# Patient Record
Sex: Female | Born: 1970
Health system: Southern US, Community
[De-identification: ages and names within clinical notes are randomized; demographics above are authoritative.]

## PROBLEM LIST (undated history)

## (undated) ENCOUNTER — Ambulatory Visit

## (undated) ENCOUNTER — Encounter

## (undated) ENCOUNTER — Ambulatory Visit: Attending: Pharmacist | Primary: Pharmacist

## (undated) ENCOUNTER — Ambulatory Visit: Attending: General Acute Care Hospital | Primary: General Acute Care Hospital

## (undated) DIAGNOSIS — F419 Anxiety disorder, unspecified: Secondary | ICD-10-CM

## (undated) DIAGNOSIS — L209 Atopic dermatitis, unspecified: Secondary | ICD-10-CM

## (undated) DIAGNOSIS — E669 Obesity, unspecified: Secondary | ICD-10-CM

## (undated) DIAGNOSIS — Z8711 Personal history of peptic ulcer disease: Secondary | ICD-10-CM

## (undated) DIAGNOSIS — K649 Unspecified hemorrhoids: Secondary | ICD-10-CM

## (undated) DIAGNOSIS — S92309A Fracture of unspecified metatarsal bone(s), unspecified foot, initial encounter for closed fracture: Secondary | ICD-10-CM

## (undated) DIAGNOSIS — K922 Gastrointestinal hemorrhage, unspecified: Secondary | ICD-10-CM

## (undated) DIAGNOSIS — E119 Type 2 diabetes mellitus without complications: Secondary | ICD-10-CM

## (undated) DIAGNOSIS — E1142 Type 2 diabetes mellitus with diabetic polyneuropathy: Secondary | ICD-10-CM

## (undated) DIAGNOSIS — G510 Bell's palsy: Secondary | ICD-10-CM

## (undated) DIAGNOSIS — E781 Pure hyperglyceridemia: Secondary | ICD-10-CM

## (undated) DIAGNOSIS — E785 Hyperlipidemia, unspecified: Secondary | ICD-10-CM

## (undated) DIAGNOSIS — I10 Essential (primary) hypertension: Secondary | ICD-10-CM

## (undated) DIAGNOSIS — J358 Other chronic diseases of tonsils and adenoids: Secondary | ICD-10-CM

## (undated) DIAGNOSIS — N200 Calculus of kidney: Secondary | ICD-10-CM

## (undated) DIAGNOSIS — I319 Disease of pericardium, unspecified: Secondary | ICD-10-CM

## (undated) DIAGNOSIS — K449 Diaphragmatic hernia without obstruction or gangrene: Secondary | ICD-10-CM

## (undated) DIAGNOSIS — E282 Polycystic ovarian syndrome: Secondary | ICD-10-CM

## (undated) DIAGNOSIS — K219 Gastro-esophageal reflux disease without esophagitis: Secondary | ICD-10-CM

## (undated) HISTORY — DX: Bell's palsy: G51.0

## (undated) HISTORY — DX: Obesity, unspecified: E66.9

## (undated) HISTORY — PX: CHOLECYSTECTOMY: SHX55

## (undated) HISTORY — DX: Disease of pericardium, unspecified: I31.9

## (undated) HISTORY — DX: Type 2 diabetes mellitus with diabetic polyneuropathy: E11.42

## (undated) HISTORY — DX: Gastro-esophageal reflux disease without esophagitis: K21.9

## (undated) HISTORY — DX: Anxiety disorder, unspecified: F41.9

## (undated) HISTORY — DX: Diaphragmatic hernia without obstruction or gangrene: K44.9

## (undated) HISTORY — DX: Pure hyperglyceridemia: E78.1

## (undated) HISTORY — DX: Gastrointestinal hemorrhage, unspecified: K92.2

## (undated) HISTORY — DX: Atopic dermatitis, unspecified: L20.9

## (undated) HISTORY — DX: Calculus of kidney: N20.0

## (undated) HISTORY — DX: Unspecified hemorrhoids: K64.9

## (undated) HISTORY — DX: Essential (primary) hypertension: I10

## (undated) HISTORY — DX: Hyperlipidemia, unspecified: E78.5

## (undated) HISTORY — PX: ABDOMINAL HYSTERECTOMY: SHX81

## (undated) HISTORY — DX: Other chronic diseases of tonsils and adenoids: J35.8

## (undated) HISTORY — DX: Fracture of unspecified metatarsal bone(s), unspecified foot, initial encounter for closed fracture: S92.309A

## (undated) HISTORY — DX: Personal history of peptic ulcer disease: Z87.11

## (undated) HISTORY — DX: Polycystic ovarian syndrome: E28.2

## (undated) HISTORY — DX: Type 2 diabetes mellitus without complications: E11.9

## (undated) HISTORY — PX: TUBAL LIGATION: SHX77

---

## 2004-05-31 ENCOUNTER — Ambulatory Visit: Payer: Self-pay | Admitting: Family Medicine

## 2004-06-24 ENCOUNTER — Ambulatory Visit: Payer: Self-pay | Admitting: Family Medicine

## 2004-09-21 ENCOUNTER — Ambulatory Visit: Payer: Self-pay | Admitting: Family Medicine

## 2005-01-14 ENCOUNTER — Ambulatory Visit: Payer: Self-pay | Admitting: Family Medicine

## 2005-05-31 ENCOUNTER — Ambulatory Visit: Payer: Self-pay | Admitting: Family Medicine

## 2005-07-01 ENCOUNTER — Ambulatory Visit: Payer: Self-pay | Admitting: Family Medicine

## 2005-07-28 ENCOUNTER — Ambulatory Visit: Payer: Self-pay | Admitting: Family Medicine

## 2006-05-09 DIAGNOSIS — I319 Disease of pericardium, unspecified: Secondary | ICD-10-CM

## 2006-05-09 HISTORY — DX: Disease of pericardium, unspecified: I31.9

## 2008-10-15 ENCOUNTER — Encounter: Payer: Self-pay | Admitting: Gastroenterology

## 2008-10-15 HISTORY — PX: COLONOSCOPY: SHX174

## 2012-09-08 ENCOUNTER — Encounter: Payer: Self-pay | Admitting: *Deleted

## 2012-09-08 ENCOUNTER — Encounter: Payer: 59 | Attending: Family Medicine | Admitting: *Deleted

## 2012-09-08 DIAGNOSIS — Z713 Dietary counseling and surveillance: Secondary | ICD-10-CM | POA: Insufficient documentation

## 2012-09-08 DIAGNOSIS — E119 Type 2 diabetes mellitus without complications: Secondary | ICD-10-CM | POA: Insufficient documentation

## 2012-09-08 NOTE — Progress Notes (Deleted)
Patient was seen on 09/08/2012 for the complete series of three diabetes self-management courses at [facility].  Current A1c = 7.3% on 08/2012  The following learning objectives were met by the patient during this course:  Core 1:  Gain an understanding of diabetes and what causes it  Learn how diabetes is treated and the goals of treatment  Learn why, how and when to test BG  Learn how carbohydrate affects your glucose  Receive a personal food plan and learn how to count carbohydrates  Discover how physical activity enhances glucose control and overall health  Begin to gain confidence in your ability to manage diabetes Handouts given during this class include:  Type 2 Diabetes: Basics Book  My Food Plan Book  Food and Activity Log Core 2:  Describe causes, symptoms and treatment of hypoglycemia and hyperglycemia Learn how to care for your glucose meter and strips Explain how to manage diabetes during illness  List strategies to follow meal plan when dining out  Describe the effects of alcohol on glucose and how to use it safely  Describe problem solving skills for day-to-day glucose challenges  Describe ways to remain physically active  Describe the impact of regular activity on insulin resistance Handouts given in this class:  Refrigerator magnet for Sick Day Guidelines  [facility] Oral Medication and Insulin handout  Nutritional Strategies for Weight Loss with Diabetes Core 3  Describe how diabetes changes over time  Understand why glucose may be out of target  Learn how diabetes changes over time  Learn about blood pressure, cholesterol, and heart health  Learn about lowering dietary fat and sodium  Understand the benefits of physical activity for heart health Develop problem solving skills for times when glucose numbers are puzzling Develop strategies for creating life balance  Learn how to identify if your treatment plan needs to change  Develop  strategies for dealing with stress, depression and staying motivated  Identify healthy weight-loss plans  Gain confidence that you can succeed in caring for your diabetes  Establish 2-3 goals that they will plan to diligently work on until they return  for the free 53-month follow-up visit  The following handouts were given in class:  3 Month Follow Up Visit handout  Goal setting handout  Class evaluation form  Stress Management Handout Your patient has established the following 3 month goals for diabetes self-care:  Be active 45 minutes or more 4 days a weekl  Test my glucose twice a day, 7 days a week  Follow-Up Plan:  Patient was offered a 3 month follow-up visit for diabetes self-management education.

## 2012-09-08 NOTE — Progress Notes (Signed)
Patient was seen on 09/08/2012 for the complete series of three diabetes self-management courses at [facility].  Current A1c = stated 7.3% on 08/13/2012  The following learning objectives were met by the patient during this course:  Core 1:  Gain an understanding of diabetes and what causes it  Learn how diabetes is treated and the goals of treatment  Learn why, how and when to test BG  Learn how carbohydrate affects your glucose  Receive a personal food plan and learn how to count carbohydrates  Discover how physical activity enhances glucose control and overall health  Begin to gain confidence in your ability to manage diabetes Handouts given during this class include:  Type 2 Diabetes: Basics Book  My Food Plan Book  Food and Activity Log Core 2:  Describe causes, symptoms and treatment of hypoglycemia and hyperglycemia Learn how to care for your glucose meter and strips Explain how to manage diabetes during illness  List strategies to follow meal plan when dining out  Describe the effects of alcohol on glucose and how to use it safely  Describe problem solving skills for day-to-day glucose challenges  Describe ways to remain physically active  Describe the impact of regular activity on insulin resistance Handouts given in this class:  Refrigerator magnet for Sick Day Guidelines  [facility] Oral Medication and Insulin handout  Nutritional Strategies for Weight Loss with Diabetes Core 3  Describe how diabetes changes over time  Understand why glucose Freddy Kinne be out of target  Learn how diabetes changes over time  Learn about blood pressure, cholesterol, and heart health  Learn about lowering dietary fat and sodium  Understand the benefits of physical activity for heart health Develop problem solving skills for times when glucose numbers are puzzling Develop strategies for creating life balance  Learn how to identify if your treatment plan needs to change   Develop strategies for dealing with stress, depression and staying motivated  Identify healthy weight-loss plans  Gain confidence that you can succeed in caring for your diabetes  Establish 2-3 goals that they will plan to diligently work on until they return  for the free 3-month follow-up visit  The following handouts were given in class:  3 Month Follow Up Visit handout  Goal setting handout  Class evaluation form  Stress Management Handout Your patient has established the following 3 month goals for diabetes self-care:  Be active 45 minutes or more 4 times a week  Test my glucose 2 times a day 7 days a week  Follow-Up Plan:  Patient was offered a 3 month follow-up visit for diabetes self-management education.  

## 2012-09-08 NOTE — Patient Instructions (Signed)
Goals:  Follow Diabetes Meal Plan as instructed  Eat 3 meals and 2 snacks, every 3-5 hrs  Limit carbohydrate intake to 30-45 grams carbohydrate/meal  Limit carbohydrate intake to 0-15 grams carbohydrate/snack  Add lean protein foods to meals/snacks  Monitor glucose levels as instructed by your doctor  Aim for 15-30 mins of physical activity daily  Bring food record and glucose log to your next nutrition visit

## 2012-09-17 ENCOUNTER — Other Ambulatory Visit: Payer: Self-pay | Admitting: Internal Medicine

## 2012-09-17 DIAGNOSIS — R079 Chest pain, unspecified: Secondary | ICD-10-CM

## 2012-09-18 ENCOUNTER — Encounter: Payer: Self-pay | Admitting: *Deleted

## 2012-09-25 ENCOUNTER — Encounter: Payer: Self-pay | Admitting: Pharmacist

## 2012-09-27 ENCOUNTER — Telehealth: Payer: Self-pay | Admitting: Internal Medicine

## 2012-09-27 ENCOUNTER — Ambulatory Visit (HOSPITAL_COMMUNITY)
Admission: RE | Admit: 2012-09-27 | Discharge: 2012-09-27 | Disposition: A | Discharge status: Home / Self Care | Payer: 59 | Source: Ambulatory Visit | Attending: Internal Medicine | Admitting: Internal Medicine

## 2012-09-27 DIAGNOSIS — R6889 Other general symptoms and signs: Secondary | ICD-10-CM | POA: Insufficient documentation

## 2012-09-27 DIAGNOSIS — R079 Chest pain, unspecified: Secondary | ICD-10-CM

## 2012-09-27 LAB — BUN: BUN: 10 mg/dL (ref 6–23)

## 2012-09-27 LAB — CREATININE, SERUM: GFR calc Af Amer: >90 mL/min (ref >90)

## 2012-09-27 MED ORDER — NITROGLYCERIN 0.4 MG SL SUBL
SUBLINGUAL_TABLET | SUBLINGUAL | Status: AC
  Filled 2012-09-27: qty 25

## 2012-09-27 MED ORDER — METOPROLOL TARTRATE 1 MG/ML IV SOLN
INTRAVENOUS | Status: AC
  Filled 2012-09-27: qty 5

## 2012-09-27 MED ORDER — METOPROLOL TARTRATE 1 MG/ML IV SOLN
INTRAVENOUS | Status: AC
  Administered 2012-09-27: 5 mg
  Filled 2012-09-27: qty 10

## 2012-09-27 NOTE — Telephone Encounter (Signed)
Message forwarded to L. Ingold, NP for further instructions.  

## 2012-09-27 NOTE — Telephone Encounter (Signed)
Morgan Drake is calling because Marguriete Wootan is there to get a CT of the Heart and they need an order to give a Stat Bun and Creatine . She needs to have a IV Contrast and cannot get the CT with the labs .Marland KitchenPlease Call Morgan Drake at [phone]   Thanks

## 2012-09-27 NOTE — Telephone Encounter (Signed)
Discussed w/ L. Annie Paras, NP and verbal order for BMP given.  Call to [facility] and no answer.  Orders in epic for BMP.

## 2012-10-02 ENCOUNTER — Ambulatory Visit (HOSPITAL_COMMUNITY)
Admission: RE | Admit: 2012-10-02 | Discharge: 2012-10-02 | Disposition: A | Discharge status: Home / Self Care | Payer: 59 | Source: Ambulatory Visit | Attending: Internal Medicine | Admitting: Internal Medicine

## 2012-10-02 DIAGNOSIS — R079 Chest pain, unspecified: Secondary | ICD-10-CM | POA: Insufficient documentation

## 2012-10-02 DIAGNOSIS — R6884 Jaw pain: Secondary | ICD-10-CM | POA: Insufficient documentation

## 2012-10-02 MED ORDER — IOHEXOL 350 MG/ML SOLN
80.0000 mL | Freq: Once | INTRAVENOUS | Status: AC | PRN
  Administered 2012-10-02: 80 mL via INTRAVENOUS

## 2012-10-02 MED ORDER — METOPROLOL TARTRATE 1 MG/ML IV SOLN
INTRAVENOUS | Status: AC
  Administered 2012-10-02: 5 mg
  Filled 2012-10-02: qty 10

## 2012-10-02 MED ORDER — NITROGLYCERIN 0.4 MG SL SUBL
SUBLINGUAL_TABLET | SUBLINGUAL | Status: AC
  Administered 2012-10-02: 16:00:00
  Filled 2012-10-02: qty 25

## 2012-10-02 MED ORDER — METOPROLOL TARTRATE 1 MG/ML IV SOLN
5.0000 mg | INTRAVENOUS | Status: DC | PRN
  Administered 2012-10-02: 5 mg via INTRAVENOUS

## 2012-10-03 NOTE — Progress Notes (Signed)
Patient was seen on 09/08/2012 for the complete series of three diabetes self-management courses at [facility].  Current A1c = stated 7.3% on 08/13/2012  The following learning objectives were met by the patient during this course:  Core 1:  Gain an understanding of diabetes and what causes it  Learn how diabetes is treated and the goals of treatment  Learn why, how and when to test BG  Learn how carbohydrate affects your glucose  Receive a personal food plan and learn how to count carbohydrates  Discover how physical activity enhances glucose control and overall health  Begin to gain confidence in your ability to manage diabetes Handouts given during this class include:  Type 2 Diabetes: Basics Book  My Food Plan Book  Food and Activity Log Core 2:  Describe causes, symptoms and treatment of hypoglycemia and hyperglycemia Learn how to care for your glucose meter and strips Explain how to manage diabetes during illness  List strategies to follow meal plan when dining out  Describe the effects of alcohol on glucose and how to use it safely  Describe problem solving skills for day-to-day glucose challenges  Describe ways to remain physically active  Describe the impact of regular activity on insulin resistance Handouts given in this class:  Refrigerator magnet for Sick Day Guidelines  [facility] Oral Medication and Insulin handout  Nutritional Strategies for Weight Loss with Diabetes Core 3  Describe how diabetes changes over time  Understand why glucose may be out of target  Learn how diabetes changes over time  Learn about blood pressure, cholesterol, and heart health  Learn about lowering dietary fat and sodium  Understand the benefits of physical activity for heart health Develop problem solving skills for times when glucose numbers are puzzling Develop strategies for creating life balance  Learn how to identify if your treatment plan needs to change   Develop strategies for dealing with stress, depression and staying motivated  Identify healthy weight-loss plans  Gain confidence that you can succeed in caring for your diabetes  Establish 2-3 goals that they will plan to diligently work on until they return  for the free 18-month follow-up visit  The following handouts were given in class:  3 Month Follow Up Visit handout  Goal setting handout  Class evaluation form  Stress Management Handout Your patient has established the following 3 month goals for diabetes self-care:  Be active 45 minutes or more 4 times a week  Test my glucose 2 times a day 7 days a week  Follow-Up Plan:  Patient was offered a 3 month follow-up visit for diabetes self-management education.

## 2012-10-23 ENCOUNTER — Telehealth: Payer: Self-pay | Admitting: Internal Medicine

## 2012-10-23 NOTE — Telephone Encounter (Signed)
Sent a mychart message that the CT overread shows a small nodule which will require a follow-up CT.

## 2012-11-06 ENCOUNTER — Ambulatory Visit (INDEPENDENT_AMBULATORY_CARE_PROVIDER_SITE_OTHER): Payer: Self-pay | Admitting: Family Medicine

## 2012-11-06 DIAGNOSIS — E119 Type 2 diabetes mellitus without complications: Secondary | ICD-10-CM

## 2012-11-06 NOTE — Progress Notes (Signed)
Patient presents for 3 month follow up DM as part of the employee sponsored Link to [employer]. Medications and glucose readings have been reviewed. I have also discussed with patient lifestyle interventions such as diet and exercise. Full documentation of this visit can be found in the [employer] documenting system through [employer] [facility]). Patient has set a series of personal goals and will follow up in 3 months for further review of DM.

## 2012-11-27 NOTE — Progress Notes (Signed)
Patient ID: Morgan Drake, female   DOB: 1971/01/03, 42 y.o.   MRN: [id number] ATTENDING PHYSICIAN NOTE: I have reviewed the chart and agree with the plan as detailed above. Denny Levy MD Pager [phone]

## 2012-12-31 ENCOUNTER — Ambulatory Visit: Payer: 59 | Admitting: *Deleted

## 2013-01-31 ENCOUNTER — Encounter: Payer: Self-pay | Admitting: *Deleted

## 2013-01-31 ENCOUNTER — Encounter: Payer: 59 | Attending: Family Medicine | Admitting: *Deleted

## 2013-01-31 VITALS — Ht 62.5 in | Wt 228.4 lb

## 2013-01-31 DIAGNOSIS — E119 Type 2 diabetes mellitus without complications: Secondary | ICD-10-CM | POA: Insufficient documentation

## 2013-01-31 DIAGNOSIS — Z713 Dietary counseling and surveillance: Secondary | ICD-10-CM | POA: Insufficient documentation

## 2013-01-31 NOTE — Progress Notes (Signed)
  Patient was seen on 01/31/13 for their 3 month follow-up as a part of the diabetes self-management courses at [facility]. The following learning objectives were met by your patient during this course:  A1c has dropped from 7.3% to 6.9% since attending class.  Patient self reports the following:  Diabetes control has improved since diabetes self-management training: it did after class, but other life stresses have gotten in the way Number of days blood glucose is >200: 10-15 times in past 4 months Changes in treatment plan: had to come off of Victoza, currently on Bydureon which is working better Confidence with ability to manage diabetes: 75% at this point. Areas for improvement with diabetes self-care: ongoing meal management and carb counting along with increase in activity level as able. Willingness to participate in diabetes support group: interested but too far to drive after leaving work.  Your patient has established the following 3 month goals for diabetes self-care:  Be active 45 minutes or more 4 times a week - was walking a lot to prepare for Heart Walk, and continues to walk as able  Test my glucose 2 times a day 7 days a week - currently testing once a day, because BG's are within target ranges  Please see Diabetes Flow sheet for findings related to patient's self-care.  Follow-Up Plan: Patient is eligible for a "free" 30 minute diabetes self-care appointment in the next year. Patient to call and schedule as needed.

## 2013-03-05 ENCOUNTER — Ambulatory Visit (INDEPENDENT_AMBULATORY_CARE_PROVIDER_SITE_OTHER): Payer: Self-pay | Admitting: Family Medicine

## 2013-03-05 VITALS — Wt 228.0 lb

## 2013-03-05 DIAGNOSIS — E119 Type 2 diabetes mellitus without complications: Secondary | ICD-10-CM

## 2013-03-05 NOTE — Progress Notes (Signed)
Patient presents for 3 mo f/u DM as part of the employee sponsored Link to [employer]. Medications have been reviewed. I have also discussed with patient lifestyle intervention such as diet and exercise. Full documentation of this visit can be found in the [employer] documenting system through [employer] [facility]). However specifics of this visit include the following:  Diabetes Mellitus: POC A1C 6.4, at goal. Stopped Victoza and started Bydureon in August. plan 1.) patient wants the bydureon pens. Told her to call us a few days before she needs her next refill and we will call to change it to the pens. 2.) has not received pneumovax before-will discuss with MD 3.) needs test strips and lancets, will fax for refills 4.) f/u 3 mo  Hyperlipidemia: Other patient will be getting a new lipid panel drawn soon. Her simvastatin dose will need to be at least 20 mg to be moderate intensity (she will discuss with md). She has had statin intolerances in the past and doesn't know if she can tolerate a higher dose. Discussed with her about crestor and pravastation being less likely to cause muscle aches and that we could try that if needed.  Cost Savings Intervention Outcomes: Medical problem identified; Supplies provided, proactive intervention  Patient has set a series of personal goals and will f/u in 3 mo for further review of DM.

## 2013-03-14 ENCOUNTER — Other Ambulatory Visit: Payer: Self-pay

## 2013-05-20 NOTE — Progress Notes (Signed)
Patient ID: Morgan Drake, female   DOB: [birth year], 43 y.o.   MRN: [id number] ATTENDING PHYSICIAN NOTE: I have reviewed the chart and agree with the plan as detailed above. Dorcas Mcmurray MD Pager [phone]

## 2013-06-18 ENCOUNTER — Ambulatory Visit (INDEPENDENT_AMBULATORY_CARE_PROVIDER_SITE_OTHER): Payer: Self-pay | Admitting: Family Medicine

## 2013-06-18 VITALS — BP 109/75 | HR 72 | Wt 226.0 lb

## 2013-06-18 DIAGNOSIS — E119 Type 2 diabetes mellitus without complications: Secondary | ICD-10-CM

## 2013-06-18 NOTE — Progress Notes (Signed)
Patient presents for 3 mo f/u DM as part of the employee sponsored Link to [employer]. Medications have been reviewed. I have also discussed with patient lifestyle interventions such as diet and exercise. Full documentation of this visit can be found in the [employer]tracker documenting system through [facility]). However specifics of this visit include the following:  Diabetes Mellitus:  POC A1C 6 at goal. MD recently added Farxiga 10 mg. Patient is tolerating well. Counseled on side effects and to watch for signs of possible yeast infections or UTI.  plan 1.) try to check blood sugar 1-2x/week and definitely check with symptoms of hypoglycemia 2.) f/u 3 mo  Hyperlipidemia:  patient has been intolerant to several statins and can only tolerate simvastatin 10 mg. She has tried 20 mg but couldn't tolerate the higher dose.  Hypertension:BP at goal  Patient has set a series of personal goals and will f/u in 3 mo for further review of DM

## 2013-08-29 NOTE — Progress Notes (Signed)
Patient ID: Morgan Drake, female   DOB: 1970/12/31, 43 y.o.   MRN: [id number] ATTENDING PHYSICIAN NOTE: I have reviewed the chart and agree with the plan as detailed above. Dorcas Mcmurray MD Pager [phone]

## 2013-10-01 ENCOUNTER — Ambulatory Visit (INDEPENDENT_AMBULATORY_CARE_PROVIDER_SITE_OTHER): Payer: Self-pay | Admitting: Family Medicine

## 2013-10-01 VITALS — Wt 233.0 lb

## 2013-10-01 DIAGNOSIS — E119 Type 2 diabetes mellitus without complications: Secondary | ICD-10-CM

## 2013-10-01 NOTE — Progress Notes (Signed)
Patient presents for 3 mo f/u DM as part of the employee sponsored Link to [employer]. Medications have been reviewed. I have also discussed with patient lifestyle interventions such as diet and exercise. Full documentation of this visit can be found in the [employer] documenting system through [facility]). However specifics from this visit include the following:  POC A1C 7.6, above goal. Patient has not been taking farxiga everyday. usually takes it every other day. She said there were several times that she didn't take it all. It was 38 days past due to be filled. She got a yeast infection from the [place] too so that's another reason she hasn't taken it.I filled it today and also filled her lancets and test strips  plan 1.) restart farxiga. If yeast infections continue then we will have to switch to alternate treatment (patient says if she even gets 1 more she's done with it). She mentioned wanting to try metformin back again. It had given her stomach upset in the past but she was going through an extremely stressful time and thought that might of had something to do with it. If we end up starting it back, I will make sure she gets the ER formulation. I did discuss long acting insulin (lantus) with patient but she didn't seem thrilled. For now we will restart the [place] and plan to go stick with it unless she gets another yeast infection. 2.) will restart using myfitnesspal again 3.) will start walking (patient has gained 5 lb since last visit) 4.) Begin checking blood sugar every morning and occasional 2 h PPG 5.) set the appointment for 3 mo but will call patient in 2 wks to see if her blood sugar is coming down since restarting the [place].  Patient has set a series of personal goals and will f/u in 3 mo for further review of DM

## 2013-11-28 NOTE — Progress Notes (Signed)
Patient ID: Morgan Drake, female   DOB: Jul 10, 1970, 43 y.o.   MRN: [id number] ATTENDING PHYSICIAN NOTE: I have reviewed the chart and agree with the plan as detailed above. Dorcas Mcmurray MD Pager [phone]

## 2014-01-03 ENCOUNTER — Ambulatory Visit (INDEPENDENT_AMBULATORY_CARE_PROVIDER_SITE_OTHER): Payer: Self-pay | Admitting: Family Medicine

## 2014-01-03 VITALS — Wt 230.0 lb

## 2014-01-03 DIAGNOSIS — E119 Type 2 diabetes mellitus without complications: Secondary | ICD-10-CM

## 2014-01-03 NOTE — Progress Notes (Signed)
Patient presents for 3 mo f/u DM as part of the employee sponsored Link to [employer]. Medications have been reviewed. I have also discussed with patient lifestyle interventions such as diet and exercise. Full documentation of this visit can be found in the [employer] documenting system through [facility]). However, specifics from this visit include the following:  Diabetes Mellitus:   POC A1C 6.4, at goal. Patient recently switched to [facility] [place]. She has not had it in 4 days though because she ran out of samples and we don't have a prescription for it. plan 1.) will call MD office for Rx. This med is a combo of a SGLT2 and a DPP4 inhibitor. She had problems with Invokana in the past causing yeast infections. Counseled patient that it could happen with this medicine as well. 2.) will try to start using myfitnesspal again. Goal exercise 30 minutes 5 days/week. Patient recently got a dog so she's hoping to get in exercise during walks. Hyperlipidemia: simvastatin not at optimal dose but she has had mulitple statin intolerances and this is the only med and dose she can tolerate.  Patient has set a series of personal goals and will f/u in 3 mo for further review of DM

## 2014-01-28 ENCOUNTER — Encounter: Payer: Self-pay | Admitting: Family Medicine

## 2014-01-28 NOTE — Assessment & Plan Note (Signed)
At goal.  See progress notes.

## 2014-01-28 NOTE — Progress Notes (Signed)
Patient ID: Morgan Drake, female   DOB: Aug 15, 1970, 43 y.o.   MRN: [id number] Reviewed: Agree with the documentation and management of our [facility] pharmacologist.

## 2014-05-06 ENCOUNTER — Ambulatory Visit (INDEPENDENT_AMBULATORY_CARE_PROVIDER_SITE_OTHER): Payer: Self-pay | Admitting: Family Medicine

## 2014-05-06 VITALS — BP 111/75 | HR 70 | Wt 225.0 lb

## 2014-05-06 DIAGNOSIS — E119 Type 2 diabetes mellitus without complications: Secondary | ICD-10-CM

## 2014-05-06 NOTE — Progress Notes (Signed)
Patient presents for 3 mo f/u DM as part of the employee sponsored Link to [employer]. Medications have been reviewed. I have also discussed with patient lifestyle interventions such as diet and exercise. Full documentation of this visit can be found in the [employer] documenting system through [facility]). However, specifics from this visit include the following:  Diabetes Mellitus: POC A1C 6.6, at goal. Patient was started on glyxambi and trulicity together. Although it does have some therapeutic overlap (both increase GLP-1 by different mechanisms) patient is at goal and MD doesn't want to change. plan 1.) filled glyxambi today and ordered it for patient to p/u for tomorrow 2.) patient will work on getting in recommended amount of steps with fitbit 3.( f/u 3 mo   Patient has set a series of personal goals and will f/u in 3 mo for further review of DM

## 2014-05-22 NOTE — Progress Notes (Signed)
Patient ID: Morgan Drake, female   DOB: [birth year], 44 y.o.   MRN: [id number] Reviewed: Agree with the documentation and management of our [place].

## 2014-08-05 ENCOUNTER — Other Ambulatory Visit: Payer: Self-pay | Admitting: *Deleted

## 2014-08-06 ENCOUNTER — Encounter: Payer: Self-pay | Admitting: *Deleted

## 2014-08-06 NOTE — Patient Outreach (Signed)
[facility]) Care Management   08/06/2014  Morgan Drake 01-28-1971 [id number]  Morgan Drake is an 44 y.o. female; Therapist, sports at [employer].  Subjective:  States she is bothered with allergies today. She states her fasting blood sugar is 116-124 when she is exercising.  Says she goes to the [facility] 3-4 times a week for sessions lasting at least one hour. Says she also walks at lunchtime at her work. States she is getting married on April 10th.  Objective:   ROS  States her allergies are bothering her today.  Physical Exam  Constitutional: She is oriented to person, place, and time.  Neurological: She is alert and oriented to person, place, and time.   Filed Vitals:   08/06/14 1256  BP: 125/78   Filed Weights   08/06/14 1256  Weight: 227 lb 12.8 oz (103.329 kg)   Current Medications:   Current Outpatient Prescriptions  Medication Sig Dispense Refill  . aspirin 81 MG tablet Take 81 mg by mouth daily.    . Cetirizine HCl (ZYRTEC ALLERGY) 10 MG CAPS Take 10 mg by mouth daily as needed.    . Empagliflozin-Linagliptin (GLYXAMBI) 25-5 MG TABS Take 1 tablet by mouth daily.    Marland Kitchen lisinopril (PRINIVIL,ZESTRIL) 2.5 MG tablet Take 2.5 mg by mouth daily.    Marland Kitchen loratadine (CLARITIN) 10 MG tablet Take 10 mg by mouth daily as needed for allergies.    . Omega-3 Fatty Acids (FISH OIL) 1000 MG CAPS Take 2 capsules by mouth 2 (two) times daily.    . Dulaglutide (TRULICITY) 1.5 OE/7.0JJ SOPN Inject 1.5 mg into the skin once a week.    . simvastatin (ZOCOR) 10 MG tablet Take 10 mg by mouth at bedtime.    . valACYclovir (VALTREX) 1000 MG tablet Take 1,000 mg by mouth daily as needed.      No current facility-administered medications for this visit.    Functional Status:   No flowsheet data found.  Fall/Depression Screening:    PHQ 2/9 Scores 08/05/2014  PHQ - 2 Score 0   THN CM Care Plan        Patient Outreach from 08/05/2014 in [place] Problem One  Type II DM with most recent A1C= 7.0% on 05/26/14 combined with elevated total cholesterol, triglycerides and LDL   Care Plan for Problem One  Active   Interventions for Problem One Long Term Goal  Using a picture representation, reviewed the 8 core pathophysiologic deficits in Type II diabetes. Discussed physiology of diabetes as a chronic progressive disease with the initial problem of insulin resistance in the muscle, liver and fat cells and then increased loss of beta cell function over time resulting in decreased insulin production., Discussed the need for the use of a combination of DM medications to correct the pathophysiologic core deficits and to  prevent or slow beta cell failure, Discussed role of statins, aspirin, and blood pressure medicines in the treatment of diabetes,  reviewed CHO controlled meal plan and discussed strategies to  improve blood  sugar control  with lower glycemic index food choices, provided handout on speicifc strategies to lower  cholesterol, triglycerides, and LDL    THN Long Term Goal (31-90 days)  Improved glycemic control as evidenced by A1C<7.0% at next check on 09/22/14   [facility] Long Term Goal Start Date  08/05/14     Assessment:    Type II DM with most recent  A1C= 7.0% with good understanding of self management strategies   Plan:  [facility] chronic care coordinator will meet quarterly and as needed with patient per Link To Wellness program guidelines to assist with Type II DM self-management and assess patient's progress toward mutually set goals    It was good to see you today. Keep up the good work. Congratulations on your upcoming wedding.  [place] Management Care Coordinator RN, CCM, CDE Office Phone [phone]

## 2014-11-04 ENCOUNTER — Ambulatory Visit: Payer: Self-pay | Admitting: *Deleted

## 2014-11-04 ENCOUNTER — Other Ambulatory Visit: Payer: Self-pay

## 2014-11-04 NOTE — Patient Outreach (Signed)
[facility]) Care Management  11/04/2014  Morgan Drake 02-May-1971 [id number]  Called member to reschedule appt with Kelli Churn today as provider is unavailable.  Member unable to reschedule appt at this time and she will call back to reschedule.  Plan to have care management assistant contact member to reschedule appt Peter Garter RN, [facility] Care Management Coordinator-Link to [place] Management [phone]

## 2014-11-18 ENCOUNTER — Ambulatory Visit: Payer: 59

## 2014-11-18 ENCOUNTER — Ambulatory Visit (INDEPENDENT_AMBULATORY_CARE_PROVIDER_SITE_OTHER): Payer: Self-pay | Admitting: Family Medicine

## 2014-11-18 VITALS — BP 96/67 | HR 77 | Wt 226.0 lb

## 2014-11-18 DIAGNOSIS — E119 Type 2 diabetes mellitus without complications: Secondary | ICD-10-CM

## 2014-11-18 LAB — POCT GLYCOSYLATED HEMOGLOBIN (HGB A1C): Hemoglobin A1C: 6.7

## 2014-11-18 NOTE — Progress Notes (Signed)
Patient presents for 3 mo f/u DM as part of the employee sponsored Link to [employer]. Medications have been reviewed. I have also discussed with patient lifestyle interventions such as diet and exercise.   POC A1C 6.7, at goal since resuming trulicity. No recommended changes. Hyperlipidemia-patient has started zetia since she has multiple statin intolerances. She can only tolerate simvastatin at 10 mg.   Patient has set a series of personal goals and will f/u in 3 mo for further review of DM

## 2014-11-28 ENCOUNTER — Encounter: Payer: Self-pay | Admitting: *Deleted

## 2014-11-28 NOTE — Progress Notes (Signed)
Patient ID: Morgan Drake, female   DOB: [birth year], 44 y.o.   MRN: [id number] ATTENDING PHYSICIAN NOTE: I have reviewed the chart and agree with the plan as detailed above. Dorcas Mcmurray MD Pager [phone]

## 2015-01-01 ENCOUNTER — Encounter: Payer: Self-pay | Admitting: Internal Medicine

## 2015-02-24 ENCOUNTER — Ambulatory Visit (INDEPENDENT_AMBULATORY_CARE_PROVIDER_SITE_OTHER): Payer: 59 | Admitting: Physician Assistant

## 2015-02-24 ENCOUNTER — Encounter: Payer: Self-pay | Admitting: Physician Assistant

## 2015-02-24 ENCOUNTER — Ambulatory Visit (HOSPITAL_COMMUNITY)
Admission: RE | Admit: 2015-02-24 | Discharge: 2015-02-24 | Disposition: A | Discharge status: Home / Self Care | Payer: 59 | Source: Ambulatory Visit | Attending: Cardiovascular Disease | Admitting: Cardiovascular Disease

## 2015-02-24 VITALS — BP 108/76 | HR 78 | Ht 62.5 in | Wt 218.8 lb

## 2015-02-24 DIAGNOSIS — R110 Nausea: Secondary | ICD-10-CM | POA: Diagnosis not present

## 2015-02-24 DIAGNOSIS — Z6838 Body mass index (BMI) 38.0-38.9, adult: Secondary | ICD-10-CM | POA: Diagnosis not present

## 2015-02-24 DIAGNOSIS — R0609 Other forms of dyspnea: Secondary | ICD-10-CM | POA: Insufficient documentation

## 2015-02-24 DIAGNOSIS — R0789 Other chest pain: Secondary | ICD-10-CM

## 2015-02-24 DIAGNOSIS — R0602 Shortness of breath: Secondary | ICD-10-CM | POA: Insufficient documentation

## 2015-02-24 DIAGNOSIS — E119 Type 2 diabetes mellitus without complications: Secondary | ICD-10-CM | POA: Insufficient documentation

## 2015-02-24 DIAGNOSIS — E669 Obesity, unspecified: Secondary | ICD-10-CM | POA: Insufficient documentation

## 2015-02-24 DIAGNOSIS — R9439 Abnormal result of other cardiovascular function study: Secondary | ICD-10-CM | POA: Diagnosis not present

## 2015-02-24 LAB — MYOCARDIAL PERFUSION IMAGING
CHL CUP MPHR: 177 {beats}/min
CHL CUP RESTING HR STRESS: 67 {beats}/min
CHL RATE OF PERCEIVED EXERTION: 17
CSEPEDS: 0 s
CSEPEW: 8.5 METS
Exercise duration (min): 7 min
LV sys vol: 42 mL
LVDIAVOL: 103 mL
NUC STRESS TID: 1
Peak HR: 162 {beats}/min
Percent HR: 91 %
SDS: 0
SRS: 0
SSS: 0

## 2015-02-24 MED ORDER — TECHNETIUM TC 99M SESTAMIBI GENERIC - CARDIOLITE
31.3000 | Freq: Once | INTRAVENOUS | Status: AC | PRN
  Administered 2015-02-24: 31.3 via INTRAVENOUS

## 2015-02-24 MED ORDER — PANTOPRAZOLE SODIUM 40 MG PO TBEC: 40.0000 mg | DELAYED_RELEASE_TABLET | Freq: Two times a day (BID) | ORAL | Status: DC

## 2015-02-24 MED ORDER — TECHNETIUM TC 99M SESTAMIBI GENERIC - CARDIOLITE
10.1000 | Freq: Once | INTRAVENOUS | Status: AC | PRN
  Administered 2015-02-24: 10.1 via INTRAVENOUS

## 2015-02-24 NOTE — Patient Instructions (Addendum)
Your physician has requested that you have en exercise stress myoview. For further information please visit [email or web address]. Please follow instruction sheet, as given.   Your physician has recommended you make the following change in your medication: start pantoprazole prescription. This has been sent to your pharmacy.  Your physician recommends that you schedule a follow-up appointment in: 2 weeks with [place] Poli Barrett requests for you to be seen in the lipid clinic by [facility].

## 2015-02-24 NOTE — Progress Notes (Signed)
Cardiology Office Note   Date:  02/24/2015   ID:  Morgan Drake, DOB 1970/11/05, MRN [id number]  PCP:  Marco Collie, MD  Cardiologist:  Ival Bible, PA-C   Chief Complaint  Patient presents with  . Chest Pain    patienr reports having chest pain on and off for weeks. was seen at [facility].    History of Present Illness: Morgan Drake is a 44 y.o. female with a history of DM, but no CAD. A cardiac CT was performed in 2014 that showed a calcium score 0 and normal coronary arteries. Her 71 aorta was 3.2 cm. There was a 6 x 4 mm nodule all along the right minor fissure, follow-up CT recommended in 12 months.   Morgan Drake presents for evaluation of chest pain.  For the last several weeks, she has been having regular episodes of chest pain. She calculates that she has been having chest pain 30 or 40% of the time total. The pain is always the same. It is in the center of her chest and she also gets a squeezing or band like feeling around the top upper chest. The pain in the center of her chest goes through to her back. It is associated with nausea, shortness of breath and she also has dyspnea on exertion but no diaphoresis and no vomiting. The pain level is a 6 or 7/10.  She is not continuously short of breath with the chest pain but even if she is not having the chest pain, she will have significant dyspnea on exertion. She feels like this is worse than it has been recently.  She has had problems with anxiety in the past but states that her anxiety levels are very low right now. She has tried medications including Zantac and Tums, but they have not helped. The symptoms are not exertional, and do not change with deep inspiration. There is no change with movements or position change.  She had chest pain shortly after waking yesterday morning. It lasted all day long despite over-the-counter medications. She finally went to [facility] last night after more  than 12 hours of pain. In the [facility] emergency room, her ECG was unchanged despite ongoing pain at a 6 or 7/10. They gave her medications for the pain including aspirin, sublingual nitroglycerin, Ativan, Lopressor, and Zofran. These medicines did not change the pain. Her cardiac enzymes were negative, and they wished to admit her but they did not have any beds and she would've had to spend the night in the emergency room. She stated that since her enzymes were negative she would rather go home and did so. The symptoms continued all night long and she is still hurting today, more than 24 hours after onset.  She is concerned about the pain  Because of her history of diabetes and also because she has a strong family history of premature coronary artery disease in both parents and siblings.  Past Medical History  Diagnosis Date  . Diabetes mellitus without complication ([place])   . Hyperlipidemia   . Obesity   . Pericarditis 2008    Hospitalized at [facility] overnight, told she had an MI, no cath. Thinks they said peridarditis  . Bell's palsy     Past Surgical History  Procedure Laterality Date  . Cesarean section    . Cholecystectomy    . Abdominal hysterectomy      Current Outpatient Prescriptions  Medication Sig Dispense Refill  . aspirin  81 MG tablet Take 81 mg by mouth daily.    . Cetirizine HCl (ZYRTEC ALLERGY) 10 MG CAPS Take 10 mg by mouth daily as needed.    . diclofenac sodium (VOLTAREN) 1 % GEL Apply 2 g topically at bedtime as needed.    . Dulaglutide (TRULICITY) 1.5 DG/3.8VF SOPN Inject 1.5 mg into the skin once a week.    . Empagliflozin-Linagliptin (GLYXAMBI) 25-5 MG TABS Take 1 tablet by mouth daily.    Marland Kitchen ezetimibe (ZETIA) 10 MG tablet Take 10 mg by mouth at bedtime.    . fluticasone (FLONASE) 50 MCG/ACT nasal spray Place 2 sprays into both nostrils daily.    Marland Kitchen lisinopril (PRINIVIL,ZESTRIL) 2.5 MG tablet Take 2.5 mg by mouth daily.    Marland Kitchen loratadine (CLARITIN) 10 MG tablet Take  10 mg by mouth daily as needed for allergies.    . Omega-3 Fatty Acids (FISH OIL) 1000 MG CAPS Take 2 capsules by mouth 2 (two) times daily.    . simvastatin (ZOCOR) 10 MG tablet Take 10 mg by mouth at bedtime.    . traZODone (DESYREL) 50 MG tablet Take 50 mg by mouth at bedtime.    . valACYclovir (VALTREX) 1000 MG tablet Take 1,000 mg by mouth daily as needed.      No current facility-administered medications for this visit.    Allergies:   Eggs or egg-derived products; Flu virus vaccine; and Metformin and related    Social History:  The patient  reports that she has quit smoking. Her smoking use included Cigarettes. She has a 22 pack-year smoking history. She has never used smokeless tobacco. She reports that she drinks alcohol.   Family History:  The patient's family history includes Alcoholism in her mother; COPD in her other; Depression in her mother; Heart attack in her brother; Heart disease in her mother; Hyperlipidemia in her father and mother; Hypertension in her father; Lung cancer in her father.    ROS:  Please see the history of present illness. All other systems are reviewed and negative.    PHYSICAL EXAM: VS:  BP 108/76 mmHg  Pulse 78  Ht 5' 2.5" (1.588 m)  Wt 218 lb 12.8 oz (99.247 kg)  BMI 39.36 kg/m2 , BMI Body mass index is 39.36 kg/(m^2). GEN: Well nourished, well developed, female in no acute distress HEENT: normal for age  Neck: no JVD, no carotid bruit, no masses Cardiac: RRR; no murmur, no rubs, or gallops Respiratory:  clear to auscultation bilaterally, normal work of breathing GI: soft, nontender, nondistended, + BS MS: no deformity or atrophy; no edema; distal pulses are 2+ in all 4 extremities  Skin: warm and dry, no rash Neuro:  Strength and sensation are intact Psych: euthymic mood, full affect   EKG:  EKG is not ordered today. The ekg from [facility] demonstrates sinus rhythm with no acute ischemic changes.   Recent Labs: No results  found for requested labs within last 365 days.    Lipid Panel No results found for: CHOL, TRIG, HDL, CHOLHDL, VLDL, LDLCALC, LDLDIRECT   Wt Readings from Last 3 Encounters:  02/24/15 218 lb (98.884 kg)  02/24/15 218 lb 12.8 oz (99.247 kg)  11/18/14 226 lb (102.513 kg)     Other studies Reviewed: Additional studies/ records that were reviewed today include: previous cardiac CT and other records.  ASSESSMENT AND PLAN:  1.  Chest pain: Ms. Camino and I discussed the likelihood that this was cardiac. I advised her that negative enzymes and  a normal ECG more than 12 hours after the onset of pain pretty much eliminated an MI as the cause of her symptoms.  I advised her that it was very reassuring she had had a 0 calcium score and no CAD at the cardiac CT in 2014. I advised her that since she has a known hiatal hernia, the first medication I would use to treat this would be Protonix 40 mg twice a day. She is agreeable to this.  2. Dyspnea on exertion: I advised her that I was concerned that her dyspnea on exertion could be an anginal equivalent. She has cardiac risk factors of a strong family history of premature coronary artery disease, diabetes that has been difficult to control, and significant dyslipidemia with triglycerides that have recently been over a thousand.   Because of this, I recommended that we proceed with a stress Myoview. She is agreeable to this. We will also refer her to the lipid clinic to see if the pharmacist there can help get her triglycerides down.   Current medicines are reviewed at length with the patient today.  The patient does not have concerns regarding medicines.  The following changes have been made:  Protonix 40 mg twice a day  Labs/ tests ordered today include:   Orders Placed This Encounter  Procedures  . Myocardial Perfusion Imaging     Disposition:   FU with me after the stress testing  Signed, Lenoard Aden  02/24/2015 1:57 PM      [facility] [place], [place], [place]  [address] Phone: [phone]; Fax: [phone]   '

## 2015-03-03 ENCOUNTER — Ambulatory Visit: Payer: 59

## 2015-03-09 HISTORY — PX: ESOPHAGOGASTRODUODENOSCOPY: SHX1529

## 2015-03-16 ENCOUNTER — Ambulatory Visit (INDEPENDENT_AMBULATORY_CARE_PROVIDER_SITE_OTHER): Payer: 59 | Admitting: Physician Assistant

## 2015-03-16 ENCOUNTER — Encounter: Payer: Self-pay | Admitting: Physician Assistant

## 2015-03-16 VITALS — BP 112/80 | HR 64 | Ht 62.5 in | Wt 218.0 lb

## 2015-03-16 DIAGNOSIS — R072 Precordial pain: Secondary | ICD-10-CM

## 2015-03-16 DIAGNOSIS — E785 Hyperlipidemia, unspecified: Secondary | ICD-10-CM | POA: Diagnosis not present

## 2015-03-16 DIAGNOSIS — E781 Pure hyperglyceridemia: Secondary | ICD-10-CM | POA: Diagnosis not present

## 2015-03-16 DIAGNOSIS — K219 Gastro-esophageal reflux disease without esophagitis: Secondary | ICD-10-CM

## 2015-03-16 NOTE — Progress Notes (Signed)
Cardiology Office Note   Date:  03/16/2015   ID:  Morgan Drake, DOB [birth year], MRN [id number]  PCP:  Morgan Collie, MD  Cardiologist:   Morgan Bible, PA-C   Chief Complaint  Patient presents with  . Follow-up    no dizziness  . Chest Pain    no chest pain  . Shortness of Breath    no SOB  . Edema    no swelling in legs    History of Present Illness: Morgan Drake is a 44 y.o. female with a history of DM, strong FH premature CAD, DOE, and chest pain, but no CAD. Seen for CAD 10/18, MV recommended.  Morgan Drake presents for review of results of her stress test and to follow-up on treatment changes.  Initially, she was concerned because we were unable to find a pharmacy to get her the GI cocktails. However, she was able to find one and states that they have helped a great deal. Since then, she has seen a GI physician and had an EGD with biopsies. The biopsies are pending, but her symptoms are greatly improved on a combination of Protonix and ranitidine.  She has not had any further episodes of chest pain. She is greatly relieved to hear that her stress test was normal and that she has no indications of ischemia or a prior MI.  She was very concerned about the GI issues because she had an ulcer at age 45. She is feeling much more positive about things in general and is doing very well.  Past Medical History  Diagnosis Date  . Diabetes mellitus without complication ([place])   . Hyperlipidemia   . Obesity   . Pericarditis 2008    Hospitalized at [facility] overnight, told she had an MI, no cath. Thinks they said pericarditis  . Bell's palsy   . Hiatal hernia   . Hypertriglyceridemia     > 1000    Past Surgical History  Procedure Laterality Date  . Cesarean section    . Cholecystectomy    . Abdominal hysterectomy      Current Outpatient Prescriptions  Medication Sig Dispense Refill  . aspirin 81 MG tablet Take 81 mg by mouth daily.    . Cetirizine HCl  (ZYRTEC ALLERGY) 10 MG CAPS Take 10 mg by mouth daily as needed.    . Dulaglutide (TRULICITY) 1.5 ES/9.2ZR SOPN Inject 1.5 mg into the skin once a week.    . Empagliflozin-Linagliptin (GLYXAMBI) 25-5 MG TABS Take 1 tablet by mouth daily.    Marland Kitchen ezetimibe (ZETIA) 10 MG tablet Take 10 mg by mouth at bedtime.    . fluticasone (FLONASE) 50 MCG/ACT nasal spray Place 2 sprays into both nostrils daily.    Marland Kitchen lisinopril (PRINIVIL,ZESTRIL) 2.5 MG tablet Take 2.5 mg by mouth daily.    Marland Kitchen loratadine (CLARITIN) 10 MG tablet Take 10 mg by mouth daily as needed for allergies.    . pantoprazole (PROTONIX) 40 MG tablet Take 1 tablet (40 mg total) by mouth 2 (two) times daily before a meal. 60 tablet 11  . simvastatin (ZOCOR) 10 MG tablet Take 10 mg by mouth at bedtime.    . traZODone (DESYREL) 50 MG tablet Take 50 mg by mouth at bedtime.     No current facility-administered medications for this visit.    Allergies:   Eggs or egg-derived products; Flu virus vaccine; and Metformin and related    Social History:  The patient  reports that  she has quit smoking. Her smoking use included Cigarettes. She has a 22 pack-year smoking history. She has never used smokeless tobacco. She reports that she drinks alcohol.   Family History:  The patient's family history includes Alcoholism in her mother; COPD in her other; Depression in her mother; Heart attack in her brother; Heart disease in her mother; Hyperlipidemia in her father and mother; Hypertension in her father; Lung cancer in her father.    ROS:  Please see the history of present illness. All other systems are reviewed and negative.    PHYSICAL EXAM: VS:  BP 112/80 mmHg  Pulse 64  Ht 5' 2.5" (1.588 m)  Wt 218 lb (98.884 kg)  BMI 39.21 kg/m2 , BMI Body mass index is 39.21 kg/(m^2). GEN: Well nourished, well developed, female in no acute distress HEENT: normal for age  Neck: no JVD, no carotid bruit, no masses Cardiac: RRR; no murmur, no rubs, or  gallops Respiratory:  clear to auscultation bilaterally, normal work of breathing GI: soft, nontender, nondistended, + BS MS: no deformity or atrophy; no edema; distal pulses are 2+ in all 4 extremities  Skin: warm and dry, no rash Neuro:  Strength and sensation are intact Psych: euthymic mood, full affect   EKG:  EKG is not ordered today.  Recent Labs: No results found for requested labs within last 365 days.   MV  The left ventricular ejection fraction is normal (55-65%).  Nuclear stress EF: 59%.  There was no ST segment deviation noted during stress. The study is normal.V: 02/24/2015  Lipid Panel No results found for: CHOL, TRIG, HDL, CHOLHDL, VLDL, LDLCALC, LDLDIRECT   Wt Readings from Last 3 Encounters:  03/16/15 218 lb (98.884 kg)  02/24/15 218 lb (98.884 kg)  02/24/15 218 lb 12.8 oz (99.247 kg)     Other studies Reviewed: Additional studies/ records that were reviewed today include: Stress test and previous notes.  ASSESSMENT AND PLAN:  1.  Precordial chest pain: Her symptoms are clearly GI INR 10 now. She is very relieved to learn that her stress test was low risk, no scar or ischemia and normal EF. She is encouraged to continue a heart-healthy lifestyle and follow-up with cardiology on a when necessary basis.  2. Dyslipidemia with hypertriglyceridemia: She is currently being managed by Dr. Nyra Drake in [place]. She had gemfibrozil added to her medication regimen, but the pharmacy was reluctant fell it. She requests our opinion. I was able to speak to the pharmacist about it.  She was not able to tolerate omega-3's. She also believes that she did not tolerate fenofibrate in the past. She has had problems with multiple statins with side effects including joint problems. That is the reason she is on a very low dose of Zocor at 10 mg and Zetia at 10 mg. She is currently tolerating those 2 medications plus the gemfibrozil, dose unclear. Our pharmacist agrees that there is  an increased risk of rhabdomyolysis with simvastatin and gemfibrozil. However, the simvastatin dose is low and she has been taking it without side effects. Morgan Drake is to follow-up with Dr. Nyra Drake as scheduled. If this medication combination does not work, consideration can be given to referral to our lipid clinic.   Current medicines are reviewed at length with the patient today.  The patient has concerns regarding medicines. All questions were answered  The following changes have been made:  no change  Labs/ tests ordered today include:  No orders of the defined types were placed  in this encounter.   Disposition:   FU with cardiology and the lipid clinic when necessary  Signed, Lenoard Aden  03/16/2015 2:03 PM    [facility] [place], [place], [place]  [address] Phone: [phone]; Fax: [phone]

## 2015-03-16 NOTE — Patient Instructions (Signed)
Rosaria Ferries, PA-C, recommends that you follow-up with cardiology/lipid clinic as needed.

## 2015-03-19 ENCOUNTER — Ambulatory Visit: Payer: 59 | Admitting: Pharmacist Clinician (PhC)/ Clinical Pharmacy Specialist

## 2015-04-09 LAB — HEMOGLOBIN A1C: HEMOGLOBIN A1C: 7.1

## 2015-05-26 MED FILL — traZODone HCL 50 MG TABS: 50 | 90 days supply | Qty: 90 | Fill #0

## 2015-06-24 MED FILL — GLYXAMBI 25 MG-5 MG TABLET: 25-5 | 30 days supply | Qty: 30 | Fill #0

## 2015-06-24 MED FILL — raNITIdine HCL 150 MG TABS: 150 | 30 days supply | Qty: 30 | Fill #0

## 2015-06-27 DIAGNOSIS — J0100 Acute maxillary sinusitis, unspecified: Secondary | ICD-10-CM | POA: Diagnosis not present

## 2015-07-21 MED FILL — SIMVASTATIN 10 MG TABLET: 10 | 90 days supply | Qty: 90 | Fill #0

## 2015-07-21 MED FILL — TRULICITY 1.5 MG/0.5 ML PEN: 1.5 | 84 days supply | Qty: 6 | Fill #1

## 2015-07-21 MED FILL — GLYXAMBI 25 MG-5 MG TABLET: 25-5 | 30 days supply | Qty: 30 | Fill #1

## 2015-07-29 MED FILL — raNITIdine HCL 150 MG TABS: 150 | 30 days supply | Qty: 30 | Fill #1

## 2015-07-29 MED FILL — PANTOPRAZOLE SOD DR 40 MG T: 40 | 90 days supply | Qty: 180 | Fill #1

## 2015-08-12 MED FILL — FENOFIBRATE 160 MG TABLET: 160 | 90 days supply | Qty: 90 | Fill #1

## 2015-08-14 MED FILL — LISINOPRIL 2.5 MG TABLET: 2.5 | 30 days supply | Qty: 30 | Fill #0

## 2015-08-25 ENCOUNTER — Ambulatory Visit: Payer: 59

## 2015-09-02 MED FILL — GLYXAMBI 25 MG-5 MG TABLET: 25-5 | 30 days supply | Qty: 30 | Fill #2

## 2015-09-02 MED FILL — raNITIdine HCL 150 MG TABS: 150 | 90 days supply | Qty: 90 | Fill #2

## 2015-09-07 DIAGNOSIS — E1142 Type 2 diabetes mellitus with diabetic polyneuropathy: Secondary | ICD-10-CM | POA: Diagnosis not present

## 2015-09-07 DIAGNOSIS — L209 Atopic dermatitis, unspecified: Secondary | ICD-10-CM | POA: Diagnosis not present

## 2015-09-07 DIAGNOSIS — E1140 Type 2 diabetes mellitus with diabetic neuropathy, unspecified: Secondary | ICD-10-CM | POA: Diagnosis not present

## 2015-09-11 DIAGNOSIS — B373 Candidiasis of vulva and vagina: Secondary | ICD-10-CM | POA: Diagnosis not present

## 2015-09-11 DIAGNOSIS — N760 Acute vaginitis: Secondary | ICD-10-CM | POA: Diagnosis not present

## 2015-09-30 MED FILL — LISINOPRIL 2.5 MG TABLET: 2.5 | 30 days supply | Qty: 30 | Fill #1

## 2015-09-30 MED FILL — GLYXAMBI 25 MG-5 MG TABLET: 25-5 | 30 days supply | Qty: 30 | Fill #3

## 2015-10-06 ENCOUNTER — Other Ambulatory Visit: Payer: Self-pay

## 2015-10-06 VITALS — BP 108/76 | HR 70 | Resp 16 | Ht 62.5 in | Wt 227.2 lb

## 2015-10-06 DIAGNOSIS — E119 Type 2 diabetes mellitus without complications: Secondary | ICD-10-CM

## 2015-10-06 NOTE — Patient Outreach (Addendum)
[facility]) Care Management   10/06/2015  Morgan Drake 11-27-1970 [id number]  Morgan Drake is an 45 y.o. female.   Member seen for follow up office visit for [facility] program for self management of Type 2 diabetes.  Member had previously been followed by [facility] [employer] pharmacist.   Subjective: Member states that her last hemoglobin A1C was a few weeks ago and thinks it was around 7%.  States she is going to see an endocrinologist next month.  States she checks her blood sugars every other day alternating from fasting to after meals.  States that her fasting blood sugars are always higher than her after meal readings.  States her morning readings range 180-220 and after meals range 110-120.  States she tries to walk every day if possible.  States that she has had a rash and she has been trying to figure out what is causing it so she has been off of some of her medications to try to find out what might be causing it.  States that she has seen her MD about it and is thinking about going to see an allergist.  States she is on her diabetes medications now.     Objective:   Review of Systems  All other systems reviewed and are negative.   Physical Exam Today's Vitals   10/06/15 1548  BP: 108/76  Pulse: 70  Resp: 16  Height: 1.588 m (5' 2.5")  Weight: 227 lb 3.2 oz (103.057 kg)  SpO2: 98%  PainSc: 0-No pain   Encounter Medications:   Outpatient Encounter Prescriptions as of 10/06/2015  Medication Sig Note  . aspirin 81 MG tablet Take 81 mg by mouth daily. 08/06/2014: States she takes every 3-4 days because if she takes every day she get excessive bruising  . Cetirizine HCl (ZYRTEC ALLERGY) 10 MG CAPS Take 10 mg by mouth daily as needed.   . Dulaglutide (TRULICITY) 1.5 0000000 SOPN Inject 1.5 mg into the skin once a week. 11/18/2014: Has resumed trulicity  . Empagliflozin-Linagliptin (GLYXAMBI) 25-5 MG TABS Take 1 tablet by mouth daily.   . fenofibrate 160  MG tablet Take 160 mg by mouth daily.   . fluticasone (FLONASE) 50 MCG/ACT nasal spray Place 2 sprays into both nostrils daily.   Marland Kitchen lisinopril (PRINIVIL,ZESTRIL) 2.5 MG tablet Take 2.5 mg by mouth daily.   Marland Kitchen loratadine (CLARITIN) 10 MG tablet Take 10 mg by mouth daily as needed for allergies.   . pantoprazole (PROTONIX) 40 MG tablet Take 1 tablet (40 mg total) by mouth 2 (two) times daily before a meal.   . ranitidine (ZANTAC) 150 MG tablet Take 150 mg by mouth daily. 03/16/2015: Received from: External Pharmacy Received Sig:   . simvastatin (ZOCOR) 10 MG tablet Take 10 mg by mouth at bedtime.   . traZODone (DESYREL) 50 MG tablet Take 50 mg by mouth at bedtime as needed.    . Alum & Mag Hydroxide-Simeth (GI COCKTAIL) SUSP suspension Take 30 mLs by mouth as needed. Reported on 10/06/2015 03/16/2015: Received from: External Pharmacy Received Sig:   . ezetimibe (ZETIA) 10 MG tablet Take 10 mg by mouth at bedtime. Reported on 10/06/2015   . gemfibrozil (LOPID) 600 MG tablet Take 600 mg by mouth 2 (two) times daily before a meal. Reported on 10/06/2015    No facility-administered encounter medications on file as of 10/06/2015.    Functional Status:   In your present state of health, do you have any  difficulty performing the following activities: 10/06/2015 11/18/2014  Hearing? N N  Vision? N N  Difficulty concentrating or making decisions? N N  Walking or climbing stairs? N N  Dressing or bathing? N N  Doing errands, shopping? N N    Fall/Depression Screening:    PHQ 2/9 Scores 10/06/2015 08/05/2014  PHQ - 2 Score 0 0    Assessment:   Member seen for follow up office visit for Link to Wellness program for self management of Type 2 diabetes.  Member is slightly over diabetes self management goal of hemoglobin A1C of less than 7% with last reading of 7.1%.  Member is to see endocrinologist on 11/09/15.  Member reports higher CBGs fasting.  Member reports trying to follow a low CHO diet and walks daily.   Member up to date on annual eye exam and dental check ups.  Plan:  Plan to eat 30-45 GM (2-3) servings of carbohydrate a meal and 15 GM for snacks.  Plan to eat snack with protein at bedtime Plan to check blood sugar every other day either fasting or 1 -2hrs after a meal.  Goals of 80-130 fasting and 180 or less after eating.  Please bring glucometer to next visit. Plan walk 45 minutes 5 times a week. Plan to complete EMMI programs by 11/21/15 Plan to return to [facility]ness on 01/07/16 at 3:30PM  [facility] CM Care Plan Problem One        Most Recent Value   Care Plan Problem One  Potential for elevated blood sugars related to dx of Type 2 DM   Role Documenting the Problem One  Care Management Coordinator   Care Plan for Problem One  Active   [facility] Long Term Goal (31-90 days)  Member will maintain hemoglobin A1C at or below 7% for the next 90 days   [facility] Long Term Goal Start Date  10/06/15   Interventions for Problem One Long Term Goal  Reinforced CHO counting and portion control, Assigned EMMI programs on CHO counting and Checking blood sugars, Instructed to try eating a small snack with protein and 15 GM of CHO at bedtime to see if this will help decrease her higher fasting blood sugars, Instructed that endocrinologist might change some of her medications to help lower her AM blood sugars, Instructed on hypoglycemia the rule of 15 and how to treat hypoglycemia, Given hand out on hypoglycemia, Instructed to continue to walk daily and the benefits of exercise for her glycemic control, Instructed to keep her appt with the endocrinologist on 11/09/15,    Peter Garter RN, [facility] Care Management Coordinator-[facility] [place] Management [phone]

## 2015-10-07 ENCOUNTER — Other Ambulatory Visit: Payer: Self-pay | Admitting: Family Medicine

## 2015-10-07 DIAGNOSIS — Z1231 Encounter for screening mammogram for malignant neoplasm of breast: Secondary | ICD-10-CM

## 2015-10-07 NOTE — Patient Instructions (Signed)
1. Plan to eat 30-45 GM (2-3) servings of carbohydrate a meal and 15 GM for snacks.  Plan to eat snack with protein at bedtime 2. Plan to check blood sugar every other day either fasting or 1 -2hrs after a meal.  Goals of 80-130 fasting and 180 or less after eating.  Please bring glucometer to next visit. 3. Plan walk 45 minutes 5 times a week. 4. Plan to complete EMMI programs by 11/21/15 5. Plan to return to [facility] to Wellness on 01/07/16 at 3:30PM

## 2015-10-14 MED FILL — TRUE METRIX GLUCOSE TEST ST: 90 days supply | Qty: 100 | Fill #2

## 2015-10-22 ENCOUNTER — Ambulatory Visit
Admission: RE | Admit: 2015-10-22 | Discharge: 2015-10-22 | Disposition: A | Discharge status: Home / Self Care | Payer: 59 | Source: Ambulatory Visit | Attending: Family Medicine | Admitting: Family Medicine

## 2015-10-22 DIAGNOSIS — Z1231 Encounter for screening mammogram for malignant neoplasm of breast: Secondary | ICD-10-CM

## 2015-10-26 DIAGNOSIS — H5212 Myopia, left eye: Secondary | ICD-10-CM | POA: Diagnosis not present

## 2015-10-26 DIAGNOSIS — H524 Presbyopia: Secondary | ICD-10-CM | POA: Diagnosis not present

## 2015-10-26 DIAGNOSIS — H52221 Regular astigmatism, right eye: Secondary | ICD-10-CM | POA: Diagnosis not present

## 2015-10-27 ENCOUNTER — Encounter: Payer: Self-pay | Admitting: Internal Medicine

## 2015-11-04 DIAGNOSIS — Z8489 Family history of other specified conditions: Secondary | ICD-10-CM | POA: Diagnosis not present

## 2015-11-09 ENCOUNTER — Other Ambulatory Visit: Payer: Self-pay

## 2015-11-09 ENCOUNTER — Telehealth: Payer: Self-pay | Admitting: Internal Medicine

## 2015-11-09 ENCOUNTER — Ambulatory Visit (INDEPENDENT_AMBULATORY_CARE_PROVIDER_SITE_OTHER): Payer: 59 | Admitting: Internal Medicine

## 2015-11-09 ENCOUNTER — Telehealth: Payer: Self-pay

## 2015-11-09 ENCOUNTER — Encounter: Payer: Self-pay | Admitting: Internal Medicine

## 2015-11-09 VITALS — BP 118/82 | HR 73 | Ht 63.0 in | Wt 225.0 lb

## 2015-11-09 DIAGNOSIS — Z8349 Family history of other endocrine, nutritional and metabolic diseases: Secondary | ICD-10-CM

## 2015-11-09 DIAGNOSIS — E1165 Type 2 diabetes mellitus with hyperglycemia: Secondary | ICD-10-CM

## 2015-11-09 DIAGNOSIS — Z8489 Family history of other specified conditions: Secondary | ICD-10-CM | POA: Diagnosis not present

## 2015-11-09 LAB — TSH: TSH: 1.74 u[IU]/mL (ref 0.35–4.50)

## 2015-11-09 LAB — T3, FREE: T3, Free: 3.1 pg/mL (ref 2.3–4.2)

## 2015-11-09 LAB — POCT GLYCOSYLATED HEMOGLOBIN (HGB A1C): HEMOGLOBIN A1C: 6.7

## 2015-11-09 LAB — T4, FREE: Free T4: 0.94 ng/dL (ref 0.60–1.60)

## 2015-11-09 MED ORDER — METFORMIN HCL ER 500 MG PO TB24: 1000.0000 mg | ORAL_TABLET | Freq: Every day | ORAL | Status: DC

## 2015-11-09 MED ORDER — CANAGLIFLOZIN 100 MG PO TABS: 100.0000 mg | ORAL_TABLET | Freq: Every day | ORAL | Status: DC

## 2015-11-09 MED FILL — INVOKANA 100 MG TABLET: 100 | 30 days supply | Qty: 30 | Fill #0

## 2015-11-09 MED FILL — TRUEplus LANCETS 30G MISC: 50 days supply | Qty: 100 | Fill #0

## 2015-11-09 MED FILL — METFORMIN HCL ER 500 MG TAB: 500 | 30 days supply | Qty: 60 | Fill #0

## 2015-11-09 NOTE — Telephone Encounter (Signed)
PT wanted to confirm that her new prescriptions will be sent to [facility].

## 2015-11-09 NOTE — Patient Instructions (Addendum)
Please start Metformin ER 500 mg with dinner for the next 4 days. Increase to 1000 mg with dinner if no GI side effects.   Stop Glyxambi.   Start Invokana 100 mg in am, before b'fast.  Continue Trulicity 1.5 mg weekly.  Please let me know if the sugars are consistently <80 or >200.  Please return in 1.5 months with your sugar log.   PATIENT INSTRUCTIONS FOR TYPE 2 DIABETES:  **Please join MyChart!** - see attached instructions about how to join if you have not done so already.  DIET AND EXERCISE Diet and exercise is an important part of diabetic treatment.  We recommended aerobic exercise in the form of brisk walking (working between 40-60% of maximal aerobic capacity, similar to brisk walking) for 150 minutes per week (such as 30 minutes five days per week) along with 3 times per week performing 'resistance' training (using various gauge rubber tubes with handles) 5-10 exercises involving the major muscle groups (upper body, lower body and core) performing 10-15 repetitions (or near fatigue) each exercise. Start at half the above goal but build slowly to reach the above goals. If limited by weight, joint pain, or disability, we recommend daily walking in a swimming pool with water up to waist to reduce pressure from joints while allow for adequate exercise.    BLOOD GLUCOSES Monitoring your blood glucoses is important for continued management of your diabetes. Please check your blood glucoses 2-4 times a day: fasting, before meals and at bedtime (you can rotate these measurements - e.g. one day check before the 3 meals, the next day check before 2 of the meals and before bedtime, etc.).   HYPOGLYCEMIA (low blood sugar) Hypoglycemia is usually a reaction to not eating, exercising, or taking too much insulin/ other diabetes drugs.  Symptoms include tremors, sweating, hunger, confusion, headache, etc. Treat IMMEDIATELY with 15 grams of Carbs: . 4 glucose tablets .  cup regular  juice/soda . 2 tablespoons raisins . 4 teaspoons sugar . 1 tablespoon honey Recheck blood glucose in 15 mins and repeat above if still symptomatic/blood glucose <100.  RECOMMENDATIONS TO REDUCE YOUR RISK OF DIABETIC COMPLICATIONS: * Take your prescribed MEDICATION(S) * Follow a DIABETIC diet: Complex carbs, fiber rich foods, (monounsaturated and polyunsaturated) fats * AVOID saturated/trans fats, high fat foods, >2,300 mg salt per day. * EXERCISE at least 5 times a week for 30 minutes or preferably daily.  * DO NOT SMOKE OR DRINK more than 1 drink a day. * Check your FEET every day. Do not wear tightfitting shoes. Contact us if you develop an ulcer * See your EYE doctor once a year or more if needed * Get a FLU shot once a year * Get a PNEUMONIA vaccine once before and once after age 19 years  GOALS:  * Your Hemoglobin A1c of <7%  * fasting sugars need to be <130 * after meals sugars need to be <180 (2h after you start eating) * Your Systolic BP should be [id number] or lower  * Your Diastolic BP should be 80 or lower  * Your HDL (Good Cholesterol) should be 40 or higher  * Your LDL (Bad Cholesterol) should be 100 or lower. * Your Triglycerides should be 150 or lower  * Your Urine microalbumin (kidney function) should be <30 * Your Body Mass Index should be 25 or lower    Please consider the following ways to cut down carbs and fat and increase fiber and micronutrients in your diet: - substitute  whole grain for white bread or pasta - substitute brown rice for white rice - substitute 90-calorie flat bread pieces for slices of bread when possible - substitute sweet potatoes or yams for white potatoes - substitute humus for margarine - substitute tofu for cheese when possible - substitute almond or rice milk for regular milk (would not drink soy milk daily due to concern for soy estrogen influence on breast cancer risk) - substitute dark chocolate for other sweets when possible -  substitute water - can add lemon or orange slices for taste - for diet sodas (artificial sweeteners will trick your body that you can eat sweets without getting calories and will lead you to overeating and weight gain in the long run) - do not skip breakfast or other meals (this will slow down the metabolism and will result in more weight gain over time)  - can try smoothies made from fruit and almond/rice milk in am instead of regular breakfast - can also try old-fashioned (not instant) oatmeal made with almond/rice milk in am - order the dressing on the side when eating salad at a restaurant (pour less than half of the dressing on the salad) - eat as little meat as possible - can try juicing, but should not forget that juicing will get rid of the fiber, so would alternate with eating raw veg./fruits or drinking smoothies - use as little oil as possible, even when using olive oil - can dress a salad with a mix of balsamic vinegar and lemon juice, for e.g. - use agave nectar, stevia sugar, or regular sugar rather than artificial sweateners - steam or broil/roast veggies  - snack on veggies/fruit/nuts (unsalted, preferably) when possible, rather than processed foods - reduce or eliminate aspartame in diet (it is in diet sodas, chewing gum, etc) Read the labels!  Try to read Dr. Janene Harvey book: "Program for Reversing Diabetes" for other ideas for healthy eating.

## 2015-11-09 NOTE — Addendum Note (Signed)
Addended by: Philemon Kingdom on: 11/09/2015 11:15 AM   Modules accepted: Orders

## 2015-11-09 NOTE — Progress Notes (Addendum)
Patient ID: Morgan Drake, female   DOB: 14-Jul-1970, 45 y.o.   MRN: [id number]  HPI: Morgan Drake is a 45 y.o.-year-old female, referred by Dr. [name] ([place], [place]), for management of DM2, dx as GDM in 1993 and 1998 and DM2 in 1999, non-insulin-dependent, uncontrolled, without complications. The referral was requested by the pt. PCP: Dr Helene Kelp.  Last hemoglobin A1c was: 09/07/2015: 7.1% Lab Results  Component Value Date   HGBA1C 7.1 04/09/2015   HGBA1C 6.7 11/18/2014   Pt is on a regimen of: - Glyxambi 25-5 mg at bedtime (!) - Trulicity 1.5 mg weekly She was on Metformin >> tolerated it well for years, then started to have IBS - like sxs and had to stop. She was on Actos, Avandia. She was on Glipizide >> worked.  She refused insulin.  Pt checks her sugars 0-1x a day and they are: - am: 180-225 - 2h after b'fast: n/c - before lunch: 135-140 - 2h after lunch: n/c - before dinner: 95-98 - 2h after dinner: 130-160 - bedtime: 110-108 - nighttime: n/c No lows. Lowest sugar was 40s (right after dx), lately 90s; she has hypoglycemia awareness at 90.  Highest sugar was 225 during the day  Glucometer: One Touch Ultra  Pt's meals are: - Breakfast: 1/2 biscuit + lettuce, tomatoes, bacon or bacon or sausage - Lunch: salad + meat  - Dinner: salad + meat + beans or baked potatoes - Snacks: 1: cheese nips or fruit  - no CKD, last BUN/creatinine:  09/07/2015: BUN/creatinine 10/0.78, EGFR 93. At that time, glucose 226. AST and ALT normal. Lab Results  Component Value Date   BUN 10 09/27/2012   CREATININE 0.64 09/27/2012  04/2015: results not available On Lisinopril. - last set of lipids: 09/07/2015: 203/120/74/105 04/2015: 216/169/59/116 No results found for: CHOL, HDL, LDLCALC, LDLDIRECT, TRIG, CHOLHDL  On Simvastatin. - last eye exam was in 10/2015 -Dr Bing Plume. + small cataracts. No DR.  - no numbness and tingling in her feet. She had this, now  better after giving up bread. 2 daughters with gluten sensitivity and 1 daughter with Chrohn ds. On ASA 81.   Pt has FH of DM in F, MGM, PGM.   Brother and Mother have Graves ds.  She also has a history of fatty liver, hypertriglyceridemia, IBS with both constipation and diarrhea, anxiety/depression, GERD, PCOS (she had to have fertility treatment when she got pregnant both times).  ROS: Constitutional: No weight gain, + fatigue, + hot flushes but also occasionally cold intolerance, + nocturia Eyes: No blurry vision, no xerophthalmia ENT: + sore throat, no nodules palpated in throat, no dysphagia/odynophagia, no hoarseness, + hypoacusis Cardiovascular: + CP/no SOB/no palpitations/+ leg swelling Respiratory: + cough/no SOB Gastrointestinal: + N/no V/+ D/+ C, + heartburn Musculoskeletal: + muscle aches/+ joint aches Skin: + rashes, + itching, + hair loss Neurological: + tremors/no numbness/tingling/dizziness, no HA Psychiatric: + both: depression/anxiety + Low libido  Past Medical History  Diagnosis Date  . Hyperlipidemia   . Obesity   . Pericarditis 2008    Hospitalized at [facility] overnight, told she had an MI, no cath. Thinks they said pericarditis  . Bell's palsy   . Hiatal hernia   . Hypertriglyceridemia     > 1000  . PCOS (polycystic ovarian syndrome)   . Metatarsal fracture   . GI bleed   . Atopic dermatitis, mild   . Diabetes mellitus without complication ([place])     type 2  . Diabetic  peripheral neuropathy [facility])    Past Surgical History  Procedure Laterality Date  . Cesarean section    . Cholecystectomy    . Abdominal hysterectomy     Social History   Social History  . Marital Status: Single    Spouse Name: N/A  . Number of Children: 2   Occupational History  . RN  [employer]   Social History Main Topics  . Smoking status: Former Smoker -- 1.00 packs/day for 22 years    Types: Cigarettes  . Smokeless tobacco: Never Used     Comment: quit 2016  .  Alcohol Use: 0.0 oz/week    0 Standard drinks or equivalent per week     Comment: rare  . Drug Use: No   Current Outpatient Prescriptions on File Prior to Visit  Medication Sig Dispense Refill  . aspirin 81 MG tablet Take 81 mg by mouth daily.    . Cetirizine HCl (ZYRTEC ALLERGY) 10 MG CAPS Take 10 mg by mouth daily as needed.    . Dulaglutide (TRULICITY) 1.5 ZO/1.0RU SOPN Inject 1.5 mg into the skin once a week.    . Empagliflozin-Linagliptin (GLYXAMBI) 25-5 MG TABS Take 1 tablet by mouth daily.    . fluticasone (FLONASE) 50 MCG/ACT nasal spray Place 2 sprays into both nostrils daily.    Marland Kitchen glucose blood test strip 1 each by Other route as needed for other. Use as instructed    . lisinopril (PRINIVIL,ZESTRIL) 2.5 MG tablet Take 2.5 mg by mouth daily.    . pantoprazole (PROTONIX) 40 MG tablet Take 1 tablet (40 mg total) by mouth 2 (two) times daily before a meal. 60 tablet 11  . ranitidine (ZANTAC) 150 MG tablet Take 150 mg by mouth daily.  2  . simvastatin (ZOCOR) 10 MG tablet Take 10 mg by mouth at bedtime.    . Cyanocobalamin (VITAMIN B-12) 3000 MCG SUBL Place under the tongue. Reported on 11/09/2015    . fenofibrate 160 MG tablet Take 160 mg by mouth daily. Reported on 11/09/2015    . traZODone (DESYREL) 50 MG tablet Take 50 mg by mouth at bedtime as needed. Reported on 11/09/2015     No current facility-administered medications on file prior to visit.   Allergies  Allergen Reactions  . Magnesium Citrate Nausea And Vomiting  . Cinnamon Other (See Comments)    Blisters in mouth  . Eggs Or Egg-Derived Products     sensitivity   . Flu Virus Vaccine   . Metformin And Related Other (See Comments)    Extreme stomach pain. Can't control bowels.  Marland Kitchen Pineapple Other (See Comments)    Tongue feels like furry spikes    Family History  Problem Relation Age of Onset  . Lung cancer Father   . COPD Other   . Hypertension Father   . Hyperlipidemia Father   . Heart attack Brother   . Depression  Mother   . Heart disease Mother   . Alcoholism Mother   . Hyperlipidemia Mother   . Congestive Heart Failure Mother   . CAD Mother   . Anemia Mother   . GER disease Mother   . Rheum arthritis Mother   . CAD Father   . Heart attack Father   . Diabetes Father   . Mesothelioma Father   . Hyperlipidemia Father   . CAD Maternal Grandmother   . Congestive Heart Failure Maternal Grandmother   . Diabetes Maternal Grandmother   . Heart attack Maternal Grandmother   .  Diabetes Paternal Grandmother   . Esophageal cancer Maternal Grandfather    PE: BP 118/82 mmHg  Pulse 73  Ht _0  (1.6 m)  Wt 225 lb (102.059 kg)  BMI 39.87 kg/m2  SpO2 97% Wt Readings from Last 3 Encounters:  11/09/15 225 lb (102.059 kg)  10/06/15 227 lb 3.2 oz (103.057 kg)  03/16/15 218 lb (98.884 kg)   Constitutional: overweight, in NAD Eyes: PERRLA, EOMI, no exophthalmos ENT: moist mucous membranes, no thyromegaly, no cervical lymphadenopathy Cardiovascular: RRR, No MRG Respiratory: CTA B Gastrointestinal: abdomen soft, NT, ND, BS+ Musculoskeletal: no deformities, strength intact in all 4 Skin: moist, warm, no rashes Neurological: no tremor with outstretched hands, DTR normal in all 4  ASSESSMENT: 1. DM2, non-insulin-dependent, uncontrolled, without complications  2. Family history of thyroid disease  PLAN:  1. Patient with long-standing, fairly well controlled diabetes, on oral + injectable antidiabetic regimen, which became insufficient (significant hyperglycemia in am). HbA1c today>> 6.7% (great!), but sugars in am are in the 200s most of the time. - we discussed about her regimen, which contains an SGLT2 inhibitor, a DPP4 inhibitor and a GLP-1 receptor agonist. I explained that the SGLT2 inhibitor and a GLP-1 receptor agonist will amplify each other's effects and this is a very good combination. She does not have side effects from these. However, I explained that a DPP4 inhibitor is much weaker than  GLP-1 receptor agonist and they act through the same mechanisms, so we will stop the DPP4 inhibitor (Linagliptin in Glyxambi) - She has significant dawn phenomenon and I explained that there are not many medicines that would specifically address a.m. CBGs, however, metformin is one of them. She has used metformin successfully in the past, however at one point she started to develop IBS symptoms. She is wondering whether her IBS flare and metformin use were coincidental. We will try to restart metformin extended-release at bedtime, since this has a good chance to reduce her a.m. sugars. In the meantime, we'll stop take some be and start the SGLT2 inhibitor separately. I will use Invokana, since this is stronger and has a better chance to improve her a.m. sugars. This was also would benefit her weight loss more than the Jardiance part of Glyxambi. Patient agrees with the plan. - I suggested to:  Patient Instructions  Please start Metformin ER 500 mg with dinner for the next 4 days. Increase to 1000 mg with dinner if no GI side effects.   Stop Glyxambi.   Start Invokana 100 mg in am, before b'fast.  Continue Trulicity 1.5 mg weekly.  Please let me know if the sugars are consistently <80 or >200.  Please return in 1.5 months with your sugar log.   - Strongly advised her to start checking sugars at different times of the day - check 2 times a day, rotating checks - given sugar log and advised how to fill it and to bring it at next appt  - given foot care handout and explained the principles  - given instructions for hypoglycemia management "15-15 rule"  - advised for yearly eye exams >> She is up-to-date - Return to clinic in 1.5 mo with sugar log   2. Family history of thyroid disease  - Patient has an extensive family history of autoimmune thyroid disease: Both Graves and Hashimoto's disease. She would like to be tested for these. - We'll check the following tests today: TSH, free T4, free T3,  antithyroglobulin antibodies, thyroid peroxidase antibodies  Philemon Kingdom, MD PhD  [facility]   Component     Latest Ref Rng 11/09/2015  Hemoglobin A1C      6.7  T4,Free(Direct)     0.60 - 1.60 ng/dL 0.94  Triiodothyronine,Free,Serum     2.3 - 4.2 pg/mL 3.1  TSH     0.35 - 4.50 uIU/mL 1.74  Thyroglobulin Ab     <2 IU/mL <1  Thyroperoxidase Ab SerPl-aCnc     <9 IU/mL 6  No sign of thyroid ds.

## 2015-11-09 NOTE — Telephone Encounter (Signed)
Returning patient phone call regarding pharmacy and medications. Patient asking about metformin and invokana, new prescriptions have not been sent into pharmacy yet. Patient preferred pharmacy is correct on the system, patient states she will pick up medications on Wednesday.

## 2015-11-10 LAB — THYROID PEROXIDASE ANTIBODY: THYROID PEROXIDASE ANTIBODY: 6 [IU]/mL (ref <9)

## 2015-11-10 LAB — THYROGLOBULIN ANTIBODY: Thyroglobulin Ab: <1 IU/mL (ref <2)

## 2015-11-11 MED FILL — LISINOPRIL 2.5 MG TABLET: 2.5 | 30 days supply | Qty: 30 | Fill #2

## 2015-11-11 MED FILL — SIMVASTATIN 10 MG TABLET: 10 | 90 days supply | Qty: 90 | Fill #1

## 2015-11-12 ENCOUNTER — Telehealth: Payer: Self-pay

## 2015-11-12 NOTE — Telephone Encounter (Signed)
Called patient and left voicemail of normal results. Advised patient to call back if she had any questions or concerns.

## 2015-11-17 ENCOUNTER — Other Ambulatory Visit: Payer: Self-pay | Admitting: Internal Medicine

## 2015-11-17 MED ORDER — DULAGLUTIDE 1.5 MG/0.5ML ~~LOC~~ SOAJ: 1.5000 mg | SUBCUTANEOUS | Status: DC

## 2015-11-17 MED FILL — TRULICITY 1.5 MG/0.5 ML PEN: 1.5 | 84 days supply | Qty: 6 | Fill #0

## 2015-11-18 ENCOUNTER — Other Ambulatory Visit: Payer: Self-pay

## 2015-11-18 NOTE — Telephone Encounter (Signed)
Fax refill request for trulicity sent in [phone].

## 2015-12-10 MED FILL — LISINOPRIL 2.5 MG TABLET: 2.5 | 30 days supply | Qty: 30 | Fill #3

## 2015-12-10 MED FILL — raNITIdine HCL 150 MG TABS: 150 | 30 days supply | Qty: 30 | Fill #3

## 2015-12-21 ENCOUNTER — Encounter: Payer: Self-pay | Admitting: Internal Medicine

## 2015-12-21 ENCOUNTER — Ambulatory Visit (INDEPENDENT_AMBULATORY_CARE_PROVIDER_SITE_OTHER): Payer: 59 | Admitting: Internal Medicine

## 2015-12-21 VITALS — BP 102/70 | HR 68 | Ht 63.0 in | Wt 229.0 lb

## 2015-12-21 DIAGNOSIS — E1165 Type 2 diabetes mellitus with hyperglycemia: Secondary | ICD-10-CM

## 2015-12-21 DIAGNOSIS — Z8349 Family history of other endocrine, nutritional and metabolic diseases: Secondary | ICD-10-CM

## 2015-12-21 LAB — BASIC METABOLIC PANEL WITH GFR
BUN: 12 mg/dL (ref 7–25)
CHLORIDE: 105 mmol/L (ref 98–110)
CO2: 24 mmol/L (ref 20–31)
CREATININE: 0.7 mg/dL (ref 0.50–1.10)
Calcium: 8.9 mg/dL (ref 8.6–10.2)
GFR, Est African American: >89 mL/min (ref >=60)
GFR, Est Non African American: >89 mL/min (ref >=60)
GLUCOSE: 122 mg/dL — AB (ref 65–99)
Potassium: 4.5 mmol/L (ref 3.5–5.3)
SODIUM: 139 mmol/L (ref 135–146)

## 2015-12-21 NOTE — Patient Instructions (Addendum)
Please continue:  - Metformin ER 1000 mg daily with supper - Invokana 100 mg daily in am  For now, stay off Trulicity.  Please stop at the lab.  Please come back for a follow-up appointment in 2 months.

## 2015-12-21 NOTE — Progress Notes (Signed)
Patient ID: Morgan Drake, female   DOB: 08-19-1970, 45 y.o.   MRN: [id number]  HPI: Morgan Drake is a 45 y.o.-year-old female, initially referred by Dr. [name] ([place], [place]), returning for f/u for DM2, dx as GDM in 1993 and 1998 and DM2 in 1999, non-insulin-dependent, uncontrolled, without complications. PCP: Dr Helene Kelp.  Last hemoglobin A1c was: Lab Results  Component Value Date   HGBA1C 6.7 11/09/2015   HGBA1C 7.1 04/09/2015   HGBA1C 6.7 11/18/2014  09/07/2015: 7.1%  Pt was on a regimen of: - Glyxambi 25-5 mg at bedtime (!) - Trulicity 1.5 mg weekly She was on Metformin >> tolerated it well for years, then started to have IBS - like sxs and had to stop. She was on Actos, Avandia. She was on Glipizide >> worked.  She refused insulin.  At last visit, we changed to: - Metformin ER 1000 mg with dinner - Invokana 100 mg in am, before b'fast. - Trulicity 1.5 mg weekly.  Pt checks her sugars 0-1x a day and they are much better in am!: - am: 180-225 >> 90-120 - 2h after b'fast: n/c - before lunch: 135-140 >> n/c - 2h after lunch: n/c - before dinner: 95-98 >> 80-90s - 2h after dinner: 130-160 >> <160 - bedtime: 110-108 >> n/c - nighttime: n/c No lows. Lowest sugar was 40s (right after dx), lately 90s >> 80s; she has hypoglycemia awareness at 90.  Highest sugar was 225 >> 160 during the day  Glucometer: One Touch Ultra  Pt's meals are: - Breakfast: 1/2 biscuit + lettuce, tomatoes, bacon or bacon or sausage - Lunch: salad + meat  - Dinner: salad + meat + beans or baked potatoes - Snacks: 1: cheese nips or fruit  - no CKD, last BUN/creatinine:  09/07/2015: BUN/creatinine 10/0.78, EGFR 93. At that time, glucose 226. AST and ALT normal. Lab Results  Component Value Date   BUN 10 09/27/2012   CREATININE 0.64 09/27/2012  04/2015: results not available On Lisinopril. - last set of lipids: 09/07/2015: 203/120/74/105 04/2015: 216/169/59/116 No  results found for: CHOL, HDL, LDLCALC, LDLDIRECT, TRIG, CHOLHDL  On Simvastatin. - last eye exam was in 10/2015 -Dr Bing Plume. + small cataracts. No DR.  - no numbness and tingling in her feet. She had this, now better after giving up bread. 2 daughters with gluten sensitivity and 1 daughter with Chrohn ds. On ASA 81.   Brother and Mother have Graves ds.  She also has a history of fatty liver, hypertriglyceridemia, IBS with both constipation and diarrhea, anxiety/depression, GERD, PCOS (she had to have fertility treatment when she got pregnant both times).  ROS: Constitutional: no weight gain/loss, no fatigue, + hot flushes Eyes: no blurry vision, no xerophthalmia ENT: no sore throat, no nodules palpated in throat, no dysphagia/odynophagia, no hoarseness Cardiovascular: no CP/SOB/palpitations/leg swelling Respiratory: no cough/SOB Gastrointestinal: + all: N/V/D/heartburn Musculoskeletal: no muscle/joint aches Skin: no rashes Neurological: no tremors/numbness/tingling/dizziness  I reviewed pt's medications, allergies, PMH, social hx, family hx, and changes were documented in the history of present illness. Otherwise, unchanged from my initial visit note.  Past Medical History:  Diagnosis Date  . Atopic dermatitis, mild   . Bell's palsy   . Diabetes mellitus without complication ([place])    type 2  . Diabetic peripheral neuropathy ([place])   . GI bleed   . Hiatal hernia   . Hyperlipidemia   . Hypertriglyceridemia    > 1000  . Metatarsal fracture   . Obesity   .  PCOS (polycystic ovarian syndrome)   . Pericarditis 2008   Hospitalized at [facility] overnight, told she had an MI, no cath. Thinks they said pericarditis   Past Surgical History:  Procedure Laterality Date  . ABDOMINAL HYSTERECTOMY    . CESAREAN SECTION    . CHOLECYSTECTOMY     Social History   Social History  . Marital Status: Single    Spouse Name: N/A  . Number of Children: 2   Occupational History  . RN  [employer]   Social History Main Topics  . Smoking status: Former Smoker -- 1.00 packs/day for 22 years    Types: Cigarettes  . Smokeless tobacco: Never Used     Comment: quit 2016  . Alcohol Use: 0.0 oz/week    0 Standard drinks or equivalent per week     Comment: rare  . Drug Use: No   Current Outpatient Prescriptions on File Prior to Visit  Medication Sig Dispense Refill  . aspirin 81 MG tablet Take 81 mg by mouth daily.    . canagliflozin (INVOKANA) 100 MG TABS tablet Take 1 tablet (100 mg total) by mouth daily before breakfast. 30 tablet 2  . Cetirizine HCl (ZYRTEC ALLERGY) 10 MG CAPS Take 10 mg by mouth daily as needed.    . Cyanocobalamin (VITAMIN B-12) 3000 MCG SUBL Place under the tongue. Reported on 11/09/2015    . Dulaglutide (TRULICITY) 1.5 CZ/6.6AY SOPN Inject 1.5 mg into the skin once a week. 12 pen 1  . fenofibrate 160 MG tablet Take 160 mg by mouth daily. Reported on 11/09/2015    . fluticasone (FLONASE) 50 MCG/ACT nasal spray Place 2 sprays into both nostrils daily.    Marland Kitchen glucose blood test strip 1 each by Other route as needed for other. Use as instructed    . lisinopril (PRINIVIL,ZESTRIL) 2.5 MG tablet Take 2.5 mg by mouth daily.    . metFORMIN (GLUCOPHAGE-XR) 500 MG 24 hr tablet Take 2 tablets (1,000 mg total) by mouth daily with supper. 60 tablet 2  . pantoprazole (PROTONIX) 40 MG tablet Take 1 tablet (40 mg total) by mouth 2 (two) times daily before a meal. 60 tablet 11  . ranitidine (ZANTAC) 150 MG tablet Take 150 mg by mouth daily.  2  . simvastatin (ZOCOR) 10 MG tablet Take 10 mg by mouth at bedtime.    . traZODone (DESYREL) 50 MG tablet Take 50 mg by mouth at bedtime as needed. Reported on 11/09/2015    . TRUEPLUS LANCETS 30G MISC   6   No current facility-administered medications on file prior to visit.    Allergies  Allergen Reactions  . Magnesium Citrate Nausea And Vomiting  . Cinnamon Other (See Comments)    Blisters in mouth  . Eggs Or Egg-Derived Products      sensitivity   . Flu Virus Vaccine   . Metformin And Related Other (See Comments)    Extreme stomach pain. Can't control bowels.  Marland Kitchen Pineapple Other (See Comments)    Tongue feels like furry spikes    Family History  Problem Relation Age of Onset  . Lung cancer Father   . COPD Other   . Hypertension Father   . Hyperlipidemia Father   . Heart attack Brother   . Depression Mother   . Heart disease Mother   . Alcoholism Mother   . Hyperlipidemia Mother   . Congestive Heart Failure Mother   . CAD Mother   . Anemia Mother   .  GER disease Mother   . Rheum arthritis Mother   . CAD Father   . Heart attack Father   . Diabetes Father   . Mesothelioma Father   . Hyperlipidemia Father   . CAD Maternal Grandmother   . Congestive Heart Failure Maternal Grandmother   . Diabetes Maternal Grandmother   . Heart attack Maternal Grandmother   . Diabetes Paternal Grandmother   . Esophageal cancer Maternal Grandfather    PE: BP 102/70 (BP Location: Left Arm, Patient Position: Sitting)   Pulse 68   Ht '5\' 3"'  (1.6 m)   Wt 229 lb (103.9 kg)   SpO2 98%   BMI 40.57 kg/m  Wt Readings from Last 3 Encounters:  12/21/15 229 lb (103.9 kg)  11/09/15 225 lb (102.1 kg)  10/06/15 227 lb 3.2 oz (103.1 kg)   Constitutional: overweight, in NAD Eyes: PERRLA, EOMI, no exophthalmos ENT: moist mucous membranes, no thyromegaly, no cervical lymphadenopathy Cardiovascular: RRR, No MRG Respiratory: CTA B Gastrointestinal: abdomen soft, NT, ND, BS+ Musculoskeletal: no deformities, strength intact in all 4 Skin: moist, warm, no rashes Neurological: no tremor with outstretched hands, DTR normal in all 4  ASSESSMENT: 1. DM2, non-insulin-dependent, uncontrolled, without complications  2. Family history of thyroid disease  Component     Latest Ref Rng 11/09/2015  Hemoglobin A1C      6.7  T4,Free(Direct)     0.60 - 1.60 ng/dL 0.94  Triiodothyronine,Free,Serum     2.3 - 4.2 pg/mL 3.1  TSH     0.35 -  4.50 uIU/mL 1.74  Thyroglobulin Ab     <2 IU/mL <1  Thyroperoxidase Ab SerPl-aCnc     <9 IU/mL 6  All labs normal.  PLAN:  1. Patient with long-standing, fairly well controlled diabetes, on oral + injectable antidiabetic regimen, with significant hyperglycemia in am ant last visit, now much improved after changing the regimen. She feels much better after we started the above regimen but sometimes during the day she needs to eats some sweets so that she does not drop sugars in the 80s. She also has occasional N/V in the evening. She can also have diarrhea. This happens before taking Metformin. Of note, she has long-standing IBS and the diarrhea is not new. - will try to stop the Trulicity but continue Invokana and Metformin ER - reviewed last HbA1c >> 6.7%  - I suggested to:  Patient Instructions  Please continue:  - Metformin ER 1000 mg daily with supper - Invokana 100 mg daily in am  For now, stay off Trulicity.  Please stop at the lab.  Please come back for a follow-up appointment in 2 months.    - continue checking sugars at different times of the day - check 2 times a day, rotating checks - advised for yearly eye exams >> She is up-to-date - will check a BMP today since she is on Invokana - Return to clinic in 2 mo with sugar log   2. Family history of thyroid disease  - Patient has an extensive family history of autoimmune thyroid disease: Both Graves and Hashimoto's disease. She would like to be tested for these. - at last visit, we checked TSH, free T4, free T3, antithyroglobulin antibodies, thyroid peroxidase antibodies >> all were normal  Philemon Kingdom, MD PhD [facility] Endocrinology

## 2015-12-24 MED FILL — INVOKANA 100 MG TABLET: 100 | 30 days supply | Qty: 30 | Fill #1

## 2015-12-24 MED FILL — METFORMIN HCL ER 500 MG TAB: 500 | 30 days supply | Qty: 60 | Fill #1

## 2016-01-05 DIAGNOSIS — K589 Irritable bowel syndrome without diarrhea: Secondary | ICD-10-CM | POA: Diagnosis not present

## 2016-01-07 ENCOUNTER — Other Ambulatory Visit: Payer: Self-pay

## 2016-01-07 VITALS — BP 108/78 | HR 75 | Resp 14 | Ht 62.5 in | Wt 228.0 lb

## 2016-01-07 DIAGNOSIS — E119 Type 2 diabetes mellitus without complications: Secondary | ICD-10-CM

## 2016-01-07 NOTE — Patient Instructions (Signed)
1. Plan to eat 30-45 GM (2-3) servings of carbohydrate a meal and 15 GM for snacks.  Plan to eat snack with protein at bedtime 2. Plan to check blood sugar every day either fasting or 1 -2hrs after a meal.  Goals of 80-130 fasting and 180 or less after eating.  Please bring glucometer to next visit. 3. Plan walk 45 minutes 3 times a week and do strength training 3-4 times a week 4. Plan to complete EMMI programs by 04/08/16 5. Plan to keep appointment with Dr. Cruzita Lederer 02/22/16 6. Plan to return to [facility] on 04/28/16 at 3:30PM

## 2016-01-07 NOTE — Patient Outreach (Signed)
[facility]) Care Management   01/07/2016  Morgan Drake [birth year] [id number]  Morgan Drake is an 45 y.o. female.   Member seen for follow up office visit for [facility] program for self management of Type 2 diabetes  Subjective: Member states that she saw Dr.Gherghe and she changed some of her medications.  States she is now taking metformin and she is tolerating it so far.  States that her Irritable Bowel Syndrome has not been an issue with the small dose she is taking.  States that her hemoglobin A1C was down to 6.7%.  States her blood sugars have been improved but she is still having higher readings in the morning.  States she does notice that her sugars are lower if she does eat a bedtime snack with protein but she often forgets to eat a snack.  Objective:   ROS  Physical Exam Today's Vitals   01/07/16 1525 01/07/16 1529  BP: 108/78   Pulse: 75   Resp: 14   SpO2: 98%   Weight: 228 lb (103.4 kg)   Height: 1.588 m (5' 2.5")   PainSc: 0-No pain 0-No pain   Encounter Medications:   Outpatient Encounter Prescriptions as of 01/07/2016  Medication Sig Note  . aspirin 81 MG tablet Take 81 mg by mouth daily. 08/06/2014: States she takes every 3-4 days because if she takes every day she get excessive bruising  . canagliflozin (INVOKANA) 100 MG TABS tablet Take 1 tablet (100 mg total) by mouth daily before breakfast.   . Cetirizine HCl (ZYRTEC ALLERGY) 10 MG CAPS Take 10 mg by mouth daily as needed.   . Cyanocobalamin (VITAMIN B-12) 3000 MCG SUBL Place under the tongue. Reported on 11/09/2015   . fluticasone (FLONASE) 50 MCG/ACT nasal spray Place 2 sprays into both nostrils daily.   Marland Kitchen glucose blood test strip 1 each by Other route as needed for other. Use as instructed   . lisinopril (PRINIVIL,ZESTRIL) 2.5 MG tablet Take 2.5 mg by mouth daily.   . metFORMIN (GLUCOPHAGE-XR) 500 MG 24 hr tablet Take 2 tablets (1,000 mg total) by mouth daily with supper.   .  pantoprazole (PROTONIX) 40 MG tablet Take 1 tablet (40 mg total) by mouth 2 (two) times daily before a meal.   . ranitidine (ZANTAC) 150 MG tablet Take 150 mg by mouth daily. 03/16/2015: Received from: External Pharmacy Received Sig:   . simvastatin (ZOCOR) 10 MG tablet Take 10 mg by mouth at bedtime.   . traZODone (DESYREL) 50 MG tablet Take 50 mg by mouth at bedtime as needed. Reported on 11/09/2015   . TRUEPLUS LANCETS 30G MISC  11/09/2015: Received from: External Pharmacy  . fenofibrate 160 MG tablet Take 160 mg by mouth daily. Reported on 11/09/2015    No facility-administered encounter medications on file as of 01/07/2016.     Functional Status:   In your present state of health, do you have any difficulty performing the following activities: 01/07/2016 10/06/2015  Hearing? N N  Vision? N N  Difficulty concentrating or making decisions? N N  Walking or climbing stairs? N N  Dressing or bathing? N N  Doing errands, shopping? N N  Some recent data might be hidden    Fall/Depression Screening:    PHQ 2/9 Scores 01/07/2016 10/06/2015 08/05/2014  PHQ - 2 Score 0 0 0    Assessment:  Member seen for follow up office visit for [facility] program for self management of Type 2  diabetes.  Member meeting diabetes self management goal of hemoglobin A1C of less than 7% with last reading of 6.7%.  Member sa endocrinologist on 8/14 and has had some medication changes.  Member reports higher CBGs fasting.  Member reports trying to follow a low CHO diet and walks daily.  Member up to date on annual eye exam and dental check ups  Plan:  Plan to eat 30-45 GM (2-3) servings of carbohydrate a meal and 15 GM for snacks.  Plan to eat snack with protein at bedtime Plan to check blood sugar every day either fasting or 1 -2hrs after a meal.  Goals of 80-130 fasting and 180 or less after eating.  Please bring glucometer to next visit. Plan walk 45 minutes 3 times a week and do strength training 3-4 times a  week Plan to complete EMMI programs by 04/08/16 Plan to keep appointment with Dr. Cruzita Lederer 02/22/16 Plan to return to [facility] on 04/28/16 at 3:30PM  [facility] CM Care Plan Problem One   Flowsheet Row Most Recent Value  Care Plan Problem One  Potential for elevated blood sugars related to dx of Type 2 DM  Role Documenting the Problem One  Care Management Coordinator  Care Plan for Problem One  Active  [facility] Long Term Goal (31-90 days)  Member will maintain hemoglobin A1C at or below 7% for the next 90 days  [facility] Long Term Goal Start Date  01/07/16  Interventions for Problem One Long Term Goal  Reinforced CHO counting and portion control, Reassigned EMMI programs on CHO counting and Checking blood sugars, Instructed on the dawn phenomen Reinforced to try eating a small snack with protein and 15 GM of CHO at bedtime to see if this will help decrease her higher fasting blood sugars,  Reinforced s/s of hypoglycemia and  the rule of 15  to treat hypoglycemia,  Encouraged to continue to walk daily and the benefits of exercise for her glycemic control,    Peter Garter RN, [facility] Care Management Coordinator-[facility] to [place] Management [phone]

## 2016-01-18 DIAGNOSIS — L814 Other melanin hyperpigmentation: Secondary | ICD-10-CM | POA: Diagnosis not present

## 2016-01-18 DIAGNOSIS — D225 Melanocytic nevi of trunk: Secondary | ICD-10-CM | POA: Diagnosis not present

## 2016-01-19 MED FILL — raNITIdine HCL 150 MG TABS: 150 | 90 days supply | Qty: 90 | Fill #0

## 2016-01-22 MED FILL — METFORMIN HCL ER 500 MG TAB: 500 | 30 days supply | Qty: 60 | Fill #2

## 2016-01-22 MED FILL — PANTOPRAZOLE SOD DR 40 MG T: 40 | 90 days supply | Qty: 180 | Fill #2

## 2016-01-25 MED FILL — LISINOPRIL 2.5 MG TABLET: 2.5 | 30 days supply | Qty: 30 | Fill #4

## 2016-01-29 ENCOUNTER — Encounter: Payer: Self-pay | Admitting: Internal Medicine

## 2016-01-29 ENCOUNTER — Ambulatory Visit (INDEPENDENT_AMBULATORY_CARE_PROVIDER_SITE_OTHER): Payer: 59

## 2016-01-29 ENCOUNTER — Ambulatory Visit (INDEPENDENT_AMBULATORY_CARE_PROVIDER_SITE_OTHER): Payer: 59 | Admitting: Internal Medicine

## 2016-01-29 VITALS — BP 110/80 | HR 89 | Ht 62.5 in | Wt 225.0 lb

## 2016-01-29 DIAGNOSIS — R000 Tachycardia, unspecified: Secondary | ICD-10-CM | POA: Diagnosis not present

## 2016-01-29 DIAGNOSIS — R002 Palpitations: Secondary | ICD-10-CM

## 2016-01-29 NOTE — Patient Instructions (Addendum)
Medication Instructions:  Your physician recommends that you continue on your current medications as directed. Please refer to the Current Medication list given to you today.  Labwork: none  Testing/Procedures: Your physician has recommended that you wear a 48 holter monitor. Holter monitors are medical devices that record the heart's electrical activity. Doctors most often use these monitors to diagnose arrhythmias. Arrhythmias are problems with the speed or rhythm of the heartbeat. The monitor is a small, portable device. You can wear one while you do your normal daily activities. This is usually used to diagnose what is causing palpitations/syncope (passing out).   Follow-Up: AS NEEDED  Any Other Special Instructions Will Be Listed Below (If Applicable).  GO OVER TO [address] FOR MONITOR TO BE PUT ON APPT SCHDEULED FOR 5PM TODAY  If you need a refill on your cardiac medications before your next appointment, please call your pharmacy.

## 2016-02-01 DIAGNOSIS — R000 Tachycardia, unspecified: Secondary | ICD-10-CM | POA: Insufficient documentation

## 2016-02-01 DIAGNOSIS — R002 Palpitations: Secondary | ICD-10-CM | POA: Insufficient documentation

## 2016-02-01 NOTE — Progress Notes (Signed)
Cardiology Office Note   Date:  02/01/2016   ID:  Morgan Drake, DOB Dec 24, 1970, MRN [id number]  PCP:  Ronita Hipps, MD  Cardiologist: Pixie Casino, MD   Chief Complaint  Patient presents with  . Follow-up    pt c/o palitations, tachycardia, dizziness, DOE,     History of Present Illness: Morgan Drake is a 45 y.o. female with a history of DM, strong FH premature CAD, DOE, and chest pain, but no CAD. Seen for CAD 10/18, MV recommended.  [name] presents for review of results of her stress test and to follow-up on treatment changes.  Initially, she was concerned because we were unable to find a pharmacy to get her the GI cocktails. However, she was able to find one and states that they have helped a great deal. Since then, she has seen a GI physician and had an EGD with biopsies. The biopsies are pending, but her symptoms are greatly improved on a combination of Protonix and ranitidine.  She has not had any further episodes of chest pain. She is greatly relieved to hear that her stress test was normal and that she has no indications of ischemia or a prior MI.  She was very concerned about the GI issues because she had an ulcer at age 52. She is feeling much more positive about things in general and is doing very well.  01/29/2016  Morgan Drake was seen in the office today after getting my attention at work for an episode of tachycardia that she had today in the office. 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 She noted a 20-30 beat increase in her pulse. She was put on an office monitor which shows a sinus tachycardia. No clear onset or offset suggestive of arrhythmia, however, this is not completely captured. She denies chest pain with this. Recent myoview in 02/2015 was low risk. Her chest pain previously was reportedly related to bad GERD. She does  have a history of hot flashes and this has some features of that.  Past Medical History:  Diagnosis Date  . Atopic dermatitis, mild   . Bell's palsy   . Diabetes mellitus without complication ([place])    type 2  . Diabetic peripheral neuropathy ([place])   . GI bleed   . Hiatal hernia   . Hyperlipidemia   . Hypertriglyceridemia    > 1000  . Metatarsal fracture   . Obesity   . PCOS (polycystic ovarian syndrome)   . Pericarditis 2008   Hospitalized at [facility] overnight, told she had an MI, no cath. Thinks they said pericarditis    Past Surgical History:  Procedure Laterality Date  . ABDOMINAL HYSTERECTOMY    . CESAREAN SECTION    . CHOLECYSTECTOMY      Current Outpatient Prescriptions  Medication Sig Dispense Refill  . aspirin 81 MG tablet Take 81 mg by mouth daily.    . Cetirizine HCl (ZYRTEC ALLERGY) 10 MG CAPS Take 10 mg by mouth daily as needed.    . Dulaglutide (TRULICITY) 1.5 0000000 SOPN Inject 1.5 mg into the skin once a week.    . Empagliflozin-Linagliptin (GLYXAMBI) 25-5 MG TABS Take 1 tablet by mouth daily.    Marland Kitchen ezetimibe (ZETIA) 10 MG tablet Take 10 mg by mouth at bedtime.    . fluticasone (FLONASE) 50 MCG/ACT nasal spray Place 2 sprays into both  nostrils daily.    Marland Kitchen lisinopril (PRINIVIL,ZESTRIL) 2.5 MG tablet Take 2.5 mg by mouth daily.    Marland Kitchen loratadine (CLARITIN) 10 MG tablet Take 10 mg by mouth daily as needed for allergies.    . pantoprazole (PROTONIX) 40 MG tablet Take 1 tablet (40 mg total) by mouth 2 (two) times daily before a meal. 60 tablet 11  . simvastatin (ZOCOR) 10 MG tablet Take 10 mg by mouth at bedtime.    . traZODone (DESYREL) 50 MG tablet Take 50 mg by mouth at bedtime.     No current facility-administered medications for this visit.    Allergies:   Magnesium citrate; Cinnamon; Eggs or egg-derived products; Flu virus vaccine; Metformin and related; and Pineapple    Social History:  The patient  reports that she has quit smoking. Her smoking use  included Cigarettes. She has a 22.00 pack-year smoking history. She has never used smokeless tobacco. She reports that she drinks alcohol.   Family History:  The patient's family history includes Alcoholism in her mother; Anemia in her mother; CAD in her father, maternal grandmother, and mother; COPD in her other; Congestive Heart Failure in her maternal grandmother and mother; Depression in her mother; Diabetes in her father, maternal grandmother, and paternal grandmother; Esophageal cancer in her maternal grandfather; GER disease in her mother; Heart attack in her brother, father, and maternal grandmother; Heart disease in her mother; Hyperlipidemia in her father, father, and mother; Hypertension in her father; Lung cancer in her father; Mesothelioma in her father; Rheum arthritis in her mother.    ROS:  Please see the history of present illness. All other systems are reviewed and negative.    PHYSICAL EXAM: VS:  BP 110/80 (BP Location: Left Arm, Patient Position: Sitting, Cuff Size: Large)   Pulse 89   Ht 5' 2.5" (1.588 m)   Wt 225 lb (102.1 kg)   SpO2 98%   BMI 40.50 kg/m  , BMI Body mass index is 40.5 kg/m. GEN: Well nourished, well developed, female in no acute distress  HEENT: normal for age  Neck: no JVD, no carotid bruit, no masses Cardiac: RRR; no murmur, no rubs, or gallops Respiratory:  clear to auscultation bilaterally, normal work of breathing GI: soft, nontender, nondistended, + BS MS: no deformity or atrophy; no edema; distal pulses are 2+ in all 4 extremities   Skin: warm and dry, no rash Neuro:  Strength and sensation are intact Psych: euthymic mood, full affect   EKG:  Reviewed telemetry which shows sinus tachycardia, no ischemic changes  Recent Labs: 11/09/2015: TSH 1.74 12/21/2015: BUN 12; Creat 0.70; Potassium 4.5; Sodium 139   MV  The left ventricular ejection fraction is normal (55-65%).  Nuclear stress EF: 59%.  There was no ST segment deviation noted  during stress. The study is normal.V: 02/24/2015  Lipid Panel No results found for: CHOL, TRIG, HDL, CHOLHDL, VLDL, LDLCALC, LDLDIRECT   Wt Readings from Last 3 Encounters:  01/29/16 225 lb (102.1 kg)  01/07/16 228 lb (103.4 kg)  12/21/15 229 lb (103.9 kg)     Other studies Reviewed: Additional studies/ records that were reviewed today include: Stress test and previous notes.  ASSESSMENT AND PLAN:  1.  Paroxysmal tachycardia - looks like a sinus tachycardia. Some positional change could suggest orthostasis. Associated flushing - checked BG which was normal (she is diabetic). Could be related to hot flashes or reflux. Recommended a 48 hour Holter to r/o arrhythmia.  She is agreeable to this.  Labs/ tests ordered today include:   Orders Placed This Encounter  Procedures  . Holter monitor - 48 hour   Disposition:   FU as needed.  Pixie Casino, MD, [facility] Attending Cardiologist [facility]  Pixie Casino, MD  02/01/2016 8:34 PM

## 2016-02-11 ENCOUNTER — Other Ambulatory Visit: Payer: Self-pay

## 2016-02-11 MED ORDER — GLUCOSE BLOOD VI STRP: ORAL_STRIP | 3 refills | Status: DC

## 2016-02-11 MED FILL — SIMVASTATIN 10 MG TABLET: 10 | 30 days supply | Qty: 30 | Fill #2

## 2016-02-11 MED FILL — INVOKANA 100 MG TABLET: 100 | 30 days supply | Qty: 30 | Fill #2

## 2016-02-11 MED FILL — TRUEplus LANCETS 30G MISC: 50 days supply | Qty: 100 | Fill #1

## 2016-02-22 ENCOUNTER — Ambulatory Visit (INDEPENDENT_AMBULATORY_CARE_PROVIDER_SITE_OTHER): Payer: 59 | Admitting: Internal Medicine

## 2016-02-22 ENCOUNTER — Encounter: Payer: Self-pay | Admitting: Internal Medicine

## 2016-02-22 VITALS — BP 114/72 | HR 65 | Ht 62.5 in | Wt 230.0 lb

## 2016-02-22 DIAGNOSIS — E1165 Type 2 diabetes mellitus with hyperglycemia: Secondary | ICD-10-CM | POA: Diagnosis not present

## 2016-02-22 LAB — POCT GLYCOSYLATED HEMOGLOBIN (HGB A1C): HEMOGLOBIN A1C: 7.9

## 2016-02-22 MED ORDER — METFORMIN HCL ER 500 MG PO TB24: 1000.0000 mg | ORAL_TABLET | Freq: Every day | ORAL | 3 refills | Status: DC

## 2016-02-22 MED ORDER — CANAGLIFLOZIN 100 MG PO TABS: 100.0000 mg | ORAL_TABLET | Freq: Every day | ORAL | 3 refills | Status: DC

## 2016-02-22 NOTE — Progress Notes (Signed)
Patient ID: Morgan Drake, female   DOB: Oct 08, 1970, 45 y.o.   MRN: [id number]  HPI: Morgan Drake is a 45 y.o.-year-old female, initially referred by Dr. [name] ([place], [place]), returning for f/u for DM2, dx as GDM in 1993 and 1998 and DM2 in 1999, non-insulin-dependent, uncontrolled, without complications. PCP: Dr Helene Kelp.  Last hemoglobin A1c was: Lab Results  Component Value Date   HGBA1C 6.7 11/09/2015   HGBA1C 7.1 04/09/2015   HGBA1C 6.7 11/18/2014  09/07/2015: 7.1%  Pt was on a regimen of: - Glyxambi 25-5 mg at bedtime (!) - Trulicity 1.5 mg weekly She was on Metformin >> tolerated it well for years, then started to have IBS - like sxs and had to stop. She was on Actos, Avandia. She was on Glipizide >> worked.  She refused insulin.  At last visit, we changed to: - Metformin ER 1000 mg with dinner - Invokana 100 mg in am, before b'fast >> she was off this for few weeks (was very busy this summer - daughter pregnant. She has [place] sd. Baby has club feet.) We had to stop Trulicity b/c nausea  Pt checks her sugars 0-1x a day and they are better in last 2 weeks - am: 180-225 >> 90-120 >> 110-111 - 2h after b'fast: n/c - before lunch: 135-140 >> n/c >> 60 (skipped b'fast) - 2h after lunch: n/c - before dinner: 95-98 >> 80-90s >> 90s - 2h after dinner: 130-160 >> <160 >> 130-140 - bedtime: 110-108 >> n/c - nighttime: n/c No lows. Lowest sugar was 40s (right after dx), lately 90s >> 80s >> 60s; she has hypoglycemia awareness at 90.  Highest sugar was 225 >> 160 during the day >> 138 postprandial.  Glucometer: One Touch Ultra  Pt's meals are: - Breakfast: 1/2 biscuit + lettuce, tomatoes, bacon or bacon or sausage - Lunch: salad + meat  - Dinner: salad + meat + beans or baked potatoes - Snacks: 1: cheese nips or fruit  - no CKD, last BUN/creatinine:  09/07/2015: BUN/creatinine 10/0.78, EGFR 93. At that time, glucose 226. AST and ALT  normal. Lab Results  Component Value Date   BUN 12 12/21/2015   BUN 10 09/27/2012   CREATININE 0.70 12/21/2015   CREATININE 0.64 09/27/2012  04/2015: results not available On Lisinopril. - last set of lipids: 09/07/2015: 203/120/74/105 04/2015: 216/169/59/116 No results found for: CHOL, HDL, LDLCALC, LDLDIRECT, TRIG, CHOLHDL  On Simvastatin. - last eye exam was in 10/2015 -Dr Bing Plume. + small cataracts. No DR.  - no numbness and tingling in her feet. She had this, now better after giving up bread. 2 daughters with gluten sensitivity and 1 daughter with Chrohn ds. On ASA 81.  Brother and Mother have Graves ds. At last visits we checked her for thyroid ds >> investig. Was negative:  Component     Latest Ref Rng 11/09/2015  Hemoglobin A1C      6.7  T4,Free(Direct)     0.60 - 1.60 ng/dL 0.94  Triiodothyronine,Free,Serum     2.3 - 4.2 pg/mL 3.1  TSH     0.35 - 4.50 uIU/mL 1.74  Thyroglobulin Ab     <2 IU/mL <1  Thyroperoxidase Ab SerPl-aCnc     <9 IU/mL 6  All labs normal.  She also has a history of fatty liver, hypertriglyceridemia, IBS with both constipation and diarrhea, anxiety/depression, GERD, PCOS (she had to have fertility treatment when she got pregnant both times).  ROS: Constitutional: no weight gain/loss,  no fatigue, + hot flushes Eyes: no blurry vision, no xerophthalmia ENT: no sore throat, no nodules palpated in throat, no dysphagia/odynophagia, no hoarseness Cardiovascular: no CP/SOB/palpitations/leg swelling Respiratory: no cough/SOB Gastrointestinal: + all: N/V/D/heartburn Musculoskeletal: no muscle/joint aches Skin: no rashes Neurological: no tremors/numbness/tingling/dizziness  I reviewed pt's medications, allergies, PMH, social hx, family hx, and changes were documented in the history of present illness. Otherwise, unchanged from my initial visit note.  Past Medical History:  Diagnosis Date  . Atopic dermatitis, mild   . Bell's palsy   . Diabetes  mellitus without complication ([place])    type 2  . Diabetic peripheral neuropathy ([place])   . GI bleed   . Hiatal hernia   . Hyperlipidemia   . Hypertriglyceridemia    > 1000  . Metatarsal fracture   . Obesity   . PCOS (polycystic ovarian syndrome)   . Pericarditis 2008   Hospitalized at [facility] overnight, told she had an MI, no cath. Thinks they said pericarditis   Past Surgical History:  Procedure Laterality Date  . ABDOMINAL HYSTERECTOMY    . CESAREAN SECTION    . CHOLECYSTECTOMY     Social History   Social History  . Marital Status: Single    Spouse Name: N/A  . Number of Children: 2   Occupational History  . RN  [employer]   Social History Main Topics  . Smoking status: Former Smoker -- 1.00 packs/day for 22 years    Types: Cigarettes  . Smokeless tobacco: Never Used     Comment: quit 2016  . Alcohol Use: 0.0 oz/week    0 Standard drinks or equivalent per week     Comment: rare  . Drug Use: No   Current Outpatient Prescriptions on File Prior to Visit  Medication Sig Dispense Refill  . aspirin 81 MG tablet Take 81 mg by mouth daily.    . canagliflozin (INVOKANA) 100 MG TABS tablet Take 1 tablet (100 mg total) by mouth daily before breakfast. 30 tablet 2  . Cetirizine HCl (ZYRTEC ALLERGY) 10 MG CAPS Take 10 mg by mouth daily as needed.    . Cyanocobalamin (VITAMIN B-12) 3000 MCG SUBL Place under the tongue. Reported on 11/09/2015    . fluticasone (FLONASE) 50 MCG/ACT nasal spray Place 2 sprays into both nostrils daily.    Marland Kitchen glucose blood (TRUE METRIX BLOOD GLUCOSE TEST) test strip Use as instructed check one time daily. 100 each 3  . lisinopril (PRINIVIL,ZESTRIL) 2.5 MG tablet Take 2.5 mg by mouth daily.    . metFORMIN (GLUCOPHAGE-XR) 500 MG 24 hr tablet Take 2 tablets (1,000 mg total) by mouth daily with supper. 60 tablet 2  . pantoprazole (PROTONIX) 40 MG tablet Take 1 tablet (40 mg total) by mouth 2 (two) times daily before a meal. 60 tablet 11  . ranitidine  (ZANTAC) 150 MG tablet Take 150 mg by mouth daily.  2  . simvastatin (ZOCOR) 10 MG tablet Take 10 mg by mouth at bedtime.    . traZODone (DESYREL) 50 MG tablet Take 50 mg by mouth at bedtime as needed. Reported on 11/09/2015    . TRUEPLUS LANCETS 30G MISC   6   No current facility-administered medications on file prior to visit.    Allergies  Allergen Reactions  . Magnesium Citrate Nausea And Vomiting  . Cinnamon Other (See Comments)    Blisters in mouth  . Eggs Or Egg-Derived Products     sensitivity   . Flu Virus Vaccine   .  Metformin And Related Other (See Comments)    Extreme stomach pain. Can't control bowels.  Marland Kitchen Pineapple Other (See Comments)    Tongue feels like furry spikes    Family History  Problem Relation Age of Onset  . Lung cancer Father   . COPD Other   . Hypertension Father   . Hyperlipidemia Father   . Heart attack Brother   . Depression Mother   . Heart disease Mother   . Alcoholism Mother   . Hyperlipidemia Mother   . Congestive Heart Failure Mother   . CAD Mother   . Anemia Mother   . GER disease Mother   . Rheum arthritis Mother   . CAD Father   . Heart attack Father   . Diabetes Father   . Mesothelioma Father   . Hyperlipidemia Father   . CAD Maternal Grandmother   . Congestive Heart Failure Maternal Grandmother   . Diabetes Maternal Grandmother   . Heart attack Maternal Grandmother   . Diabetes Paternal Grandmother   . Esophageal cancer Maternal Grandfather    PE: BP 114/72 (BP Location: Left Arm, Patient Position: Sitting)   Pulse 65   Ht 5' 2.5" (1.588 m)   Wt 230 lb (104.3 kg)   SpO2 98%   BMI 41.40 kg/m  Wt Readings from Last 3 Encounters:  02/22/16 230 lb (104.3 kg)  01/29/16 225 lb (102.1 kg)  01/07/16 228 lb (103.4 kg)   Constitutional: overweight, in NAD Eyes: PERRLA, EOMI, no exophthalmos ENT: moist mucous membranes, no thyromegaly, no cervical lymphadenopathy Cardiovascular: RRR, No MRG Respiratory: CTA  B Gastrointestinal: abdomen soft, NT, ND, BS+ Musculoskeletal: no deformities, strength intact in all 4 Skin: moist, warm, no rashes Neurological: no tremor with outstretched hands, DTR normal in all 4  ASSESSMENT: 1. DM2, non-insulin-dependent, uncontrolled, without complications  PLAN:  1. Patient with long-standing, fairly well controlled diabetes, on oral antidiabetic regimen, with improved sugars at last visit but since then, she was busy >> forgot Invokana for few weeks >> sugars higher. She is now on it >> sugars got better. She has occasional mild diarrhea. Of note, she has long-standing IBS and the diarrhea is not new. - we stopped Trulicity b/c N/V - reviewed last HbA1c >> 6.7% (great!); however, today: 7.9% (higher!) - I suggested to:  Patient Instructions  Please continue:  - Metformin ER 1000 mg daily with supper - Invokana 100 mg daily in am  Please come back for a follow-up appointment in 3 months.    - continue checking sugars at different times of the day - check 2 times a day, rotating checks - advised for yearly eye exams >> She is up-to-date - she had the flu shot  - Return to clinic in 3 mo with sugar log   Philemon Kingdom, MD PhD [facility] Endocrinology

## 2016-02-22 NOTE — Patient Instructions (Signed)
Patient Instructions  Please continue:  - Metformin ER 1000 mg daily with supper - Invokana 100 mg daily in am  Please come back for a follow-up appointment in 3 months.

## 2016-02-22 NOTE — Addendum Note (Signed)
Addended by: Caprice Beaver T on: 02/22/2016 10:25 AM   Modules accepted: Orders

## 2016-03-07 MED FILL — INVOKANA 100 MG TABLET: 100 | 90 days supply | Qty: 90 | Fill #0

## 2016-03-09 MED FILL — METFORMIN HCL ER 500 MG TAB: 500 | 90 days supply | Qty: 180 | Fill #0

## 2016-03-11 MED FILL — LISINOPRIL 2.5 MG TABLET: 2.5 | 30 days supply | Qty: 30 | Fill #5

## 2016-03-14 DIAGNOSIS — J00 Acute nasopharyngitis [common cold]: Secondary | ICD-10-CM | POA: Diagnosis not present

## 2016-04-21 MED FILL — raNITIdine HCL 150 MG TABS: 150 | 90 days supply | Qty: 90 | Fill #0

## 2016-04-22 MED FILL — SIMVASTATIN 10 MG TABLET: 10 | 30 days supply | Qty: 30 | Fill #0

## 2016-04-22 MED FILL — FLUTICASONE PROP 50 MCG SPR: 50 | 30 days supply | Qty: 16 | Fill #0

## 2016-04-22 MED FILL — LISINOPRIL 2.5 MG TABLET: 2.5 | 30 days supply | Qty: 30 | Fill #0

## 2016-04-28 ENCOUNTER — Ambulatory Visit: Payer: Self-pay

## 2016-05-30 ENCOUNTER — Ambulatory Visit (INDEPENDENT_AMBULATORY_CARE_PROVIDER_SITE_OTHER): Payer: 59 | Admitting: Internal Medicine

## 2016-05-30 ENCOUNTER — Encounter: Payer: Self-pay | Admitting: Internal Medicine

## 2016-05-30 VITALS — BP 130/90 | HR 72 | Ht 62.0 in | Wt 230.0 lb

## 2016-05-30 DIAGNOSIS — E1165 Type 2 diabetes mellitus with hyperglycemia: Secondary | ICD-10-CM

## 2016-05-30 LAB — POCT GLYCOSYLATED HEMOGLOBIN (HGB A1C): Hemoglobin A1C: 8.7

## 2016-05-30 MED ORDER — METFORMIN HCL ER 500 MG PO TB24: ORAL_TABLET | ORAL | 3 refills | Status: DC

## 2016-05-30 MED ORDER — GLIPIZIDE 5 MG PO TABS: ORAL_TABLET | ORAL | 3 refills | Status: DC

## 2016-05-30 MED FILL — glipiZIDE 5 MG TABS: 5 | 90 days supply | Qty: 90 | Fill #0

## 2016-05-30 NOTE — Addendum Note (Signed)
Addended by: Ena Dawley on: 05/30/2016 09:33 AM   Modules accepted: Orders

## 2016-05-30 NOTE — Progress Notes (Signed)
Patient ID: JOANN JORGE, female   DOB: [birth year], 46 y.o.   MRN: [id number]  HPI: RYLA CAUTHON is a 46 y.o.-year-old female, initially referred by Dr. [name] ([place], [place]), returning for f/u for DM2, dx as GDM in 1993 and 1998 and DM2 in 1999, non-insulin-dependent, uncontrolled, without complications. Last visit  3 mo ago. PCP: Dr Helene Kelp.  Last hemoglobin A1c was: Lab Results  Component Value Date   HGBA1C 7.9 02/22/2016   HGBA1C 6.7 11/09/2015   HGBA1C 7.1 04/09/2015  09/07/2015: 7.1%  Pt was on a regimen of: - Glyxambi 25-5 mg at bedtime (!) - Trulicity 1.5 mg weekly She was on Metformin >> tolerated it well for years, then started to have IBS - like sxs and had to stop. She was on Actos, Avandia. She was on Glipizide >> worked.  She refused insulin.  She is now on: - Metformin ER 1000 mg with dinner She stopped Invokana 100 mg in am, before b'fast >> stopped b/c repeated yeast infections. We had to stop Trulicity b/c nausea/vomiting. HbA1c increased afterwards  Pt checks her sugars 0-1x a day: - am: 180-225 >> 90-120 >> 110-111 >> 148-164 - 2h after b'fast: n/c - before lunch: 135-140 >> n/c >> 60 (skipped b'fast) >> 170-180s - 2h after lunch: n/c - before dinner: 95-98 >> 80-90s >> 90s >> 120-130 - 2h after dinner: 130-160 >> <160 >> 130-140 >> 150s - bedtime: 110-108 >> n/c - nighttime: n/c No lows. Lowest sugar was 40s (right after dx), lately 90s >> 80s >> 60s; she has hypoglycemia awareness at 90.  Highest sugar was 225 >> 160 during the day >> 138 >> 180s  Glucometer: One Touch Ultra  Pt's meals are: - Breakfast: 1/2 biscuit + lettuce, tomatoes, bacon or bacon or sausage - Lunch: salad + meat  - Dinner: salad + meat + beans or baked potatoes - Snacks: 1: cheese nips or fruit  - no CKD, last BUN/creatinine:  Lab Results  Component Value Date   BUN 12 12/21/2015   BUN 10 09/27/2012   CREATININE 0.70 12/21/2015   CREATININE  0.64 09/27/2012  09/07/2015: BUN/creatinine 10/0.78, EGFR 93. At that time, glucose 226. AST and ALT normal. 04/2015: results not available On Lisinopril. - last set of lipids: 09/07/2015: 203/120/74/105 04/2015: 216/169/59/116 No results found for: CHOL, HDL, LDLCALC, LDLDIRECT, TRIG, CHOLHDL  On Simvastatin. - last eye exam was in 10/2015 -Dr Bing Plume. + small cataracts. No DR.  - no numbness and tingling in her feet. She had this, now better after giving up bread. 2 daughters have gluten sensitivity and 1 daughter with Chrohn ds. On ASA 81.  She also has a history of fatty liver, hypertriglyceridemia, IBS with both constipation and diarrhea, anxiety/depression, GERD, PCOS (she had to have fertility treatment when she got pregnant both times).  ROS: Constitutional: no weight gain/loss, no fatigue, + hot flushes Eyes: no blurry vision, no xerophthalmia ENT: no sore throat, no nodules palpated in throat, no dysphagia/odynophagia, no hoarseness Cardiovascular: no CP/SOB/palpitations/leg swelling Respiratory: no cough/SOB Gastrointestinal: no N/V/D/heartburn Musculoskeletal: no muscle/joint aches Skin: no rashes Neurological: no tremors/numbness/tingling/dizziness  I reviewed pt's medications, allergies, PMH, social hx, family hx, and changes were documented in the history of present illness. Otherwise, unchanged from my initial visit note.  Past Medical History:  Diagnosis Date  . Atopic dermatitis, mild   . Bell's palsy   . Diabetes mellitus without complication ([place])    type 2  . Diabetic peripheral  neuropathy ([place])   . GI bleed   . Hiatal hernia   . Hyperlipidemia   . Hypertriglyceridemia    > 1000  . Metatarsal fracture   . Obesity   . PCOS (polycystic ovarian syndrome)   . Pericarditis 2008   Hospitalized at [facility] overnight, told she had an MI, no cath. Thinks they said pericarditis   Past Surgical History:  Procedure Laterality Date  . ABDOMINAL HYSTERECTOMY    .  CESAREAN SECTION    . CHOLECYSTECTOMY     Social History   Social History  . Marital Status: Single    Spouse Name: N/A  . Number of Children: 2   Occupational History  . RN  [employer]   Social History Main Topics  . Smoking status: Former Smoker -- 1.00 packs/day for 22 years    Types: Cigarettes  . Smokeless tobacco: Never Used     Comment: quit 2016  . Alcohol Use: 0.0 oz/week    0 Standard drinks or equivalent per week     Comment: rare  . Drug Use: No   Current Outpatient Prescriptions on File Prior to Visit  Medication Sig Dispense Refill  . aspirin 81 MG tablet Take 81 mg by mouth daily.    . canagliflozin (INVOKANA) 100 MG TABS tablet Take 1 tablet (100 mg total) by mouth daily before breakfast. 90 tablet 3  . Cetirizine HCl (ZYRTEC ALLERGY) 10 MG CAPS Take 10 mg by mouth daily as needed.    . Cyanocobalamin (VITAMIN B-12) 3000 MCG SUBL Place under the tongue. Reported on 11/09/2015    . fluticasone (FLONASE) 50 MCG/ACT nasal spray Place 2 sprays into both nostrils daily.    Marland Kitchen glucose blood (TRUE METRIX BLOOD GLUCOSE TEST) test strip Use as instructed check one time daily. 100 each 3  . lisinopril (PRINIVIL,ZESTRIL) 2.5 MG tablet Take 2.5 mg by mouth daily.    . metFORMIN (GLUCOPHAGE-XR) 500 MG 24 hr tablet Take 2 tablets (1,000 mg total) by mouth daily with supper. 180 tablet 3  . pantoprazole (PROTONIX) 40 MG tablet Take 1 tablet (40 mg total) by mouth 2 (two) times daily before a meal. 60 tablet 11  . ranitidine (ZANTAC) 150 MG tablet Take 150 mg by mouth daily.  2  . simvastatin (ZOCOR) 10 MG tablet Take 10 mg by mouth at bedtime.    . traZODone (DESYREL) 50 MG tablet Take 50 mg by mouth at bedtime as needed. Reported on 11/09/2015    . TRUEPLUS LANCETS 30G MISC   6   No current facility-administered medications on file prior to visit.    Allergies  Allergen Reactions  . Magnesium Citrate Nausea And Vomiting  . Cinnamon Other (See Comments)    Blisters in mouth   . Eggs Or Egg-Derived Products     sensitivity   . Flu Virus Vaccine   . Metformin And Related Other (See Comments)    Extreme stomach pain. Can't control bowels.  Marland Kitchen Pineapple Other (See Comments)    Tongue feels like furry spikes    Family History  Problem Relation Age of Onset  . Lung cancer Father   . COPD Other   . Hypertension Father   . Hyperlipidemia Father   . Heart attack Brother   . Depression Mother   . Heart disease Mother   . Alcoholism Mother   . Hyperlipidemia Mother   . Congestive Heart Failure Mother   . CAD Mother   . Anemia Mother   .  GER disease Mother   . Rheum arthritis Mother   . CAD Father   . Heart attack Father   . Diabetes Father   . Mesothelioma Father   . Hyperlipidemia Father   . CAD Maternal Grandmother   . Congestive Heart Failure Maternal Grandmother   . Diabetes Maternal Grandmother   . Heart attack Maternal Grandmother   . Diabetes Paternal Grandmother   . Esophageal cancer Maternal Grandfather    PE: BP 130/90 (BP Location: Left Arm, Patient Position: Sitting)   Pulse 72   Ht 5' 2" (1.575 m)   Wt 230 lb (104.3 kg)   SpO2 98%   BMI 42.07 kg/m  Wt Readings from Last 3 Encounters:  05/30/16 230 lb (104.3 kg)  02/22/16 230 lb (104.3 kg)  01/29/16 225 lb (102.1 kg)   Constitutional: overweight, in NAD Eyes: PERRLA, EOMI, no exophthalmos ENT: moist mucous membranes, no thyromegaly, no cervical lymphadenopathy Cardiovascular: RRR, No MRG Respiratory: CTA B Gastrointestinal: abdomen soft, NT, ND, BS+ Musculoskeletal: no deformities, strength intact in all 4 Skin: moist, warm, no rashes Neurological: no tremor with outstretched hands, DTR normal in all 4  ASSESSMENT: 1. DM2, non-insulin-dependent, uncontrolled, without complications - we stopped Trulicity b/c N/V, Invokana b/c Yeast vaginitis  PLAN:  1. Patient with long-standing, fairly well controlled diabetes, on oral antidiabetic regimen, with worse sugars since last  visit, especially after coming off Invokana. We will need to increase Mtformin and will add Glipizide in am as her sugars are high mid-day.  - reviewed last HbA1c >> 7.9% (higher!), but at this visit: 8.5% - I suggested to:  Patient Instructions  Please continue:  - Metformin ER 1000 mg daily with supper, but also add a tablet of Metformin ER with b'fast  Please add: - Glipizide 5 mg before b'fast  Please come back for a follow-up appointment in 3 months.   - continue checking sugars at different times of the day - check 1-2 times a day, rotating checks - advised for yearly eye exams >> She is up-to-date - she had the flu shot this season - Return to clinic in 3 mo with sugar log   Philemon Kingdom, MD PhD [facility] Endocrinology

## 2016-05-30 NOTE — Patient Instructions (Addendum)
Please continue:  - Metformin ER 1000 mg daily with supper, but also add a tablet of Metformin ER with b'fast  Please add: - Glipizide 5 mg before b'fast  Please come back for a follow-up appointment in 3 months.

## 2016-05-31 MED FILL — FLUTICASONE PROP 50 MCG SPR: 50 | 30 days supply | Qty: 16 | Fill #1

## 2016-05-31 MED FILL — LISINOPRIL 2.5 MG TABLET: 2.5 | 30 days supply | Qty: 30 | Fill #1

## 2016-05-31 MED FILL — SIMVASTATIN 10 MG TABLET: 10 | 30 days supply | Qty: 30 | Fill #0

## 2016-06-29 MED FILL — METFORMIN HCL ER 500 MG TAB: 500 | 90 days supply | Qty: 180 | Fill #1

## 2016-06-29 MED FILL — LISINOPRIL 2.5 MG TABLET: 2.5 | 30 days supply | Qty: 30 | Fill #2

## 2016-06-29 MED FILL — SIMVASTATIN 10 MG TABLET: 10 | 30 days supply | Qty: 30 | Fill #0

## 2016-07-26 MED FILL — raNITIdine HCL 150 MG TABS: 150 | 90 days supply | Qty: 90 | Fill #1

## 2016-08-01 DIAGNOSIS — M2550 Pain in unspecified joint: Secondary | ICD-10-CM | POA: Diagnosis not present

## 2016-08-01 DIAGNOSIS — E1142 Type 2 diabetes mellitus with diabetic polyneuropathy: Secondary | ICD-10-CM | POA: Diagnosis not present

## 2016-08-01 DIAGNOSIS — Z6841 Body Mass Index (BMI) 40.0 and over, adult: Secondary | ICD-10-CM | POA: Diagnosis not present

## 2016-08-09 DIAGNOSIS — Z8639 Personal history of other endocrine, nutritional and metabolic disease: Secondary | ICD-10-CM | POA: Diagnosis not present

## 2016-08-09 DIAGNOSIS — M25562 Pain in left knee: Secondary | ICD-10-CM | POA: Diagnosis not present

## 2016-08-09 DIAGNOSIS — G8929 Other chronic pain: Secondary | ICD-10-CM | POA: Diagnosis not present

## 2016-08-09 DIAGNOSIS — M25561 Pain in right knee: Secondary | ICD-10-CM | POA: Diagnosis not present

## 2016-08-09 MED FILL — CELECOXIB 200 MG CAP: 200 | 30 days supply | Qty: 30 | Fill #0

## 2016-08-15 MED FILL — LISINOPRIL 2.5 MG TABLET: 2.5 | 30 days supply | Qty: 30 | Fill #3

## 2016-08-29 ENCOUNTER — Ambulatory Visit: Payer: 59 | Admitting: Internal Medicine

## 2016-09-07 ENCOUNTER — Other Ambulatory Visit: Payer: Self-pay | Admitting: Family Medicine

## 2016-09-07 DIAGNOSIS — Z1231 Encounter for screening mammogram for malignant neoplasm of breast: Secondary | ICD-10-CM

## 2016-09-09 MED FILL — SIMVASTATIN 10 MG TABLET: 10 | 30 days supply | Qty: 30 | Fill #0

## 2016-09-09 MED FILL — glipiZIDE 5 MG TABS: 5 | 90 days supply | Qty: 90 | Fill #1

## 2016-09-09 MED FILL — CELECOXIB 200 MG CAP: 200 | 30 days supply | Qty: 30 | Fill #1

## 2016-09-09 MED FILL — LISINOPRIL 2.5 MG TABLET: 2.5 | 30 days supply | Qty: 30 | Fill #0

## 2016-10-06 IMAGING — NM NM MISC PROCEDURE
6 series · 36 of 36 positions shown · non-contrast
Comparison: none

[Series 1: wbr rest · 6.40mm/px · 6 of 64 frames shown]
[frame 6/64]
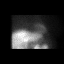
[frame 16/64]
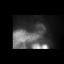
[frame 27/64]
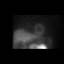
[frame 38/64]
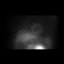
[frame 48/64]
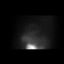
[frame 59/64]
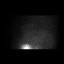

[Series 1: wbr_r-proj_st wbr rest · 6.40mm/px · 6 of 64 frames shown]
[frame 6/64]
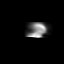
[frame 16/64]
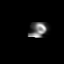
[frame 27/64]
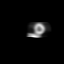
[frame 38/64]
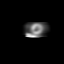
[frame 48/64]
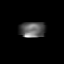
[frame 59/64]
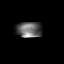

[Series 2: wbr stress-gsp · 6.40mm/px · 6 of 505 frames shown]
[frame 43/505  full-range]
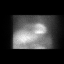
[frame 127/505  full-range]
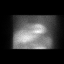
[frame 211/505  full-range]
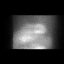
[frame 295/505  full-range]
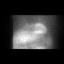
[frame 379/505  full-range]
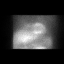
[frame 463/505  full-range]
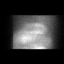

[Series 2: wbr_s-proj_st wbr stress-gsp · 6.40mm/px · 6 of 512 frames shown]
[frame 43/512]
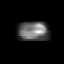
[frame 128/512]
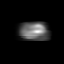
[frame 214/512]
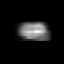
[frame 299/512]
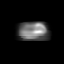
[frame 384/512]
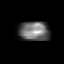
[frame 470/512]
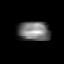

[Series 3: wbr_s-proj_st wbr stress-sum-em · 6.40mm/px · 6 of 64 frames shown]
[frame 6/64]
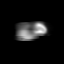
[frame 16/64]
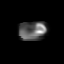
[frame 27/64]
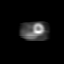
[frame 38/64]
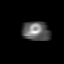
[frame 48/64]
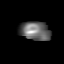
[frame 59/64]
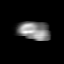

[Series 3: wbr stress-sum-em · 6.40mm/px · 6 of 64 frames shown]
[frame 6/64]
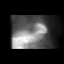
[frame 16/64]
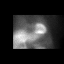
[frame 27/64]
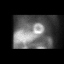
[frame 38/64]
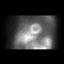
[frame 48/64]
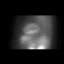
[frame 59/64]
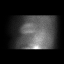

[36 of 36 positions shown; findings below may reference images not displayed]

Canned report from images found in remote index.

Refer to host system for actual result text.

## 2016-10-10 ENCOUNTER — Ambulatory Visit (INDEPENDENT_AMBULATORY_CARE_PROVIDER_SITE_OTHER): Payer: 59 | Admitting: Internal Medicine

## 2016-10-10 ENCOUNTER — Encounter: Payer: Self-pay | Admitting: Internal Medicine

## 2016-10-10 VITALS — BP 122/80 | HR 71 | Ht 63.0 in | Wt 239.0 lb

## 2016-10-10 DIAGNOSIS — E1165 Type 2 diabetes mellitus with hyperglycemia: Secondary | ICD-10-CM | POA: Diagnosis not present

## 2016-10-10 LAB — POCT GLYCOSYLATED HEMOGLOBIN (HGB A1C): Hemoglobin A1C: 7.7

## 2016-10-10 NOTE — Patient Instructions (Addendum)
Please continue:  - Metformin ER 500 mg with b'fast and 1000 mg daily with supper - Glipizide 5 mg before b'fast  Please come back for a follow-up appointment in 3 months.   Please call and schedule an eye appt with Dr. Prudencio Burly: [facility]:  Dr. Sherlyn Lick MD ?  Address: [address] [place], [place], [place] [address]  [phone]

## 2016-10-10 NOTE — Addendum Note (Signed)
Addended by: Caprice Beaver T on: 10/10/2016 09:29 AM   Modules accepted: Orders

## 2016-10-10 NOTE — Progress Notes (Signed)
+Patient ID: Morgan Drake, female   DOB: Sep 18, 1970, 46 y.o.   MRN: [id number]  HPI: Morgan Drake is a 46 y.o.-year-old female, initially referred by Dr. [name] ([place], ), returning for f/u for DM2, dx as GDM in 1993 and 1998 and DM2 in 1999, non-insulin-dependent, uncontrolled, without complications. Last visit 4 mo ago.  Last hemoglobin A1c was: Lab Results  Component Value Date   HGBA1C 8.7 05/30/2016   HGBA1C 7.9 02/22/2016   HGBA1C 6.7 11/09/2015  09/07/2015: 7.1%  Pt was on a regimen of: - Glyxambi 25-5 mg at bedtime (!) - Trulicity 1.5 mg weekly  She is now on: - Metformin ER 1000 mg with dinner + 500 mg with b'fast - dose increased 05/2015 - Glipizide 5 mg before b'fast - added 05/2015. She stopped Invokana 100 mg in am, before b'fast >> stopped b/c repeated yeast infections. We had to stop Trulicity b/c nausea/vomiting. HbA1c increased afterwards She was on reg. Metformin >> tolerated it well for years, then started to have IBS - like sxs and had to stop. She was on Actos, Avandia. She was on Glipizide >> worked.  She refused insulin in the past.  Pt checks her sugars 1-2x a day: - am: 180-225 >> 90-120 >> 110-111 >> 148-164 >> 90-128 - 2h after b'fast: n/c - before lunch:  60 (skipped b'fast) >> 170-180s >> 110s - 2h after lunch: n/c - before dinner: 95-98 >> 80-90s >> 90s >> 120-130 >> 125-145, 220 (grapes) - 2h after dinner: 130-160 >> <160 >> 130-140 >> 150s >> 170-179 - bedtime: 110-108 >> n/c - nighttime: n/c No lows. Lowest sugar was 60s >> 78; she has hypoglycemia awareness at 90.  Highest sugar was 180s >> 179.  Glucometer: One Touch Ultra  Pt's meals are: - Breakfast: 1/2 biscuit + lettuce, tomatoes, bacon or bacon or sausage - Lunch: salad + meat  - Dinner: salad + meat + beans or baked potatoes - Snacks: 1: cheese nips or fruit  - No CKD, last BUN/creatinine:  Lab Results  Component Value Date   BUN 12  12/21/2015   BUN 10 09/27/2012   CREATININE 0.70 12/21/2015   CREATININE 0.64 09/27/2012  09/07/2015: BUN/creatinine 10/0.78, EGFR 93. At that time, glucose 226. AST and ALT normal. On Lisinopril. - last set of lipids: 09/07/2015: 203/120/74/105 04/2015: 216/169/59/116 No results found for: CHOL, HDL, LDLCALC, LDLDIRECT, TRIG, CHOLHDL  On Simvastatin. - last eye exam was in 10/2015 -Dr Bing Plume. + small cataracts. No DR  - she denies numbness and tingling in her feet. She had this, but improved after giving up bread. 2 daughters have gluten sensitivity and 1 daughter with Chrohn ds.  On ASA 81.  She also has a history of fatty liver, hypertriglyceridemia, IBS with both constipation and diarrhea, anxiety/depression, GERD, PCOS (she had to have fertility treatment when she got pregnant both times).  ROS: Constitutional: no weight gain/no weight loss, no fatigue, no subjective hyperthermia, no subjective hypothermia Eyes: no blurry vision, no xerophthalmia ENT: no sore throat, no nodules palpated in throat, no dysphagia, no odynophagia, no hoarseness Cardiovascular: no CP/no SOB/no palpitations/no leg swelling Respiratory: no cough/no SOB/no wheezing Gastrointestinal: no N/no V/no D/no C/no acid reflux Musculoskeletal: no muscle aches/no joint aches Skin: no rashes, no hair loss Neurological: no tremors/no numbness/no tingling/no dizziness  I reviewed pt's medications, allergies, PMH, social hx, family hx, and changes were documented in the history of present illness. Otherwise, unchanged from my initial visit note.  Past Medical History:  Diagnosis Date  . Atopic dermatitis, mild   . Bell's palsy   . Diabetes mellitus without complication ([place])    type 2  . Diabetic peripheral neuropathy ([place])   . GI bleed   . Hiatal hernia   . Hyperlipidemia   . Hypertriglyceridemia    > 1000  . Metatarsal fracture   . Obesity   . PCOS (polycystic ovarian syndrome)   . Pericarditis 2008    Hospitalized at [facility] overnight, told she had an MI, no cath. Thinks they said pericarditis   Past Surgical History:  Procedure Laterality Date  . ABDOMINAL HYSTERECTOMY    . CESAREAN SECTION    . CHOLECYSTECTOMY     Social History   Social History  . Marital Status: Single    Spouse Name: N/A  . Number of Children: 2   Occupational History  . RN  [employer]   Social History Main Topics  . Smoking status: Former Smoker -- 1.00 packs/day for 22 years    Types: Cigarettes  . Smokeless tobacco: Never Used     Comment: quit 2016  . Alcohol Use: 0.0 oz/week    0 Standard drinks or equivalent per week     Comment: rare  . Drug Use: No   Current Outpatient Prescriptions on File Prior to Visit  Medication Sig Dispense Refill  . aspirin 81 MG tablet Take 81 mg by mouth daily.    . Cetirizine HCl (ZYRTEC ALLERGY) 10 MG CAPS Take 10 mg by mouth daily as needed.    . Cyanocobalamin (VITAMIN B-12) 3000 MCG SUBL Place under the tongue. Reported on 11/09/2015    . fluticasone (FLONASE) 50 MCG/ACT nasal spray Place 2 sprays into both nostrils daily.    Marland Kitchen glipiZIDE (GLUCOTROL) 5 MG tablet Take by mouth 5 mg before b'fast 90 tablet 3  . glucose blood (TRUE METRIX BLOOD GLUCOSE TEST) test strip Use as instructed check one time daily. 100 each 3  . lisinopril (PRINIVIL,ZESTRIL) 2.5 MG tablet Take 2.5 mg by mouth daily.    . metFORMIN (GLUCOPHAGE-XR) 500 MG 24 hr tablet Take 1 tablet in am and 2 tablets with dinner 270 tablet 3  . pantoprazole (PROTONIX) 40 MG tablet Take 1 tablet (40 mg total) by mouth 2 (two) times daily before a meal. 60 tablet 11  . ranitidine (ZANTAC) 150 MG tablet Take 150 mg by mouth daily.  2  . simvastatin (ZOCOR) 10 MG tablet Take 10 mg by mouth at bedtime.    . traZODone (DESYREL) 50 MG tablet Take 50 mg by mouth at bedtime as needed. Reported on 11/09/2015    . TRUEPLUS LANCETS 30G MISC   6   No current facility-administered medications on file prior to visit.     Allergies  Allergen Reactions  . Magnesium Citrate Nausea And Vomiting  . Cinnamon Other (See Comments)    Blisters in mouth  . Eggs Or Egg-Derived Products     sensitivity   . Flu Virus Vaccine   . Metformin And Related Other (See Comments)    Extreme stomach pain. Can't control bowels.  Marland Kitchen Pineapple Other (See Comments)    Tongue feels like furry spikes    Family History  Problem Relation Age of Onset  . Lung cancer Father   . COPD Other   . Hypertension Father   . Hyperlipidemia Father   . Heart attack Brother   . Depression Mother   . Heart disease Mother   .  Alcoholism Mother   . Hyperlipidemia Mother   . Congestive Heart Failure Mother   . CAD Mother   . Anemia Mother   . GER disease Mother   . Rheum arthritis Mother   . CAD Father   . Heart attack Father   . Diabetes Father   . Mesothelioma Father   . Hyperlipidemia Father   . CAD Maternal Grandmother   . Congestive Heart Failure Maternal Grandmother   . Diabetes Maternal Grandmother   . Heart attack Maternal Grandmother   . Diabetes Paternal Grandmother   . Esophageal cancer Maternal Grandfather    PE: BP 122/80 (BP Location: Left Arm, Patient Position: Sitting)   Pulse 71   Ht '5\' 3"'  (1.6 m)   Wt 239 lb (108.4 kg)   SpO2 96%   BMI 42.34 kg/m  Wt Readings from Last 3 Encounters:  10/10/16 239 lb (108.4 kg)  05/30/16 230 lb (104.3 kg)  02/22/16 230 lb (104.3 kg)   Constitutional: obese, in NAD Eyes: PERRLA, EOMI, no exophthalmos ENT: moist mucous membranes, no thyromegaly, no cervical lymphadenopathy Cardiovascular: RRR, No MRG Respiratory: CTA B Gastrointestinal: abdomen soft, NT, ND, BS+ Musculoskeletal: no deformities, strength intact in all 4 Skin: moist, warm, no rashes Neurological: no tremor with outstretched hands, DTR normal in all 4  ASSESSMENT: 1. DM2, non-insulin-dependent, uncontrolled, without long term complications, but with hyperglycemia - we stopped Trulicity b/c N/V,  Invokana b/c Yeast vaginitis  PLAN:  1. Patient with long-standing, uncontrolled DM in the past, improved since last visit, after adding a Metformin ER tab in am and also Glipizide before b'fast. She is feeling much better! Our scale shows a 9 lb weight gain but she mentions that by her scale at home she only gained 1 lb. - she still has some hyperglycemia before and after dinner >> fruit in the pm - no need to change the regimen - she needs to change her eye dr from Dr Bing Plume who does not take her insurance anymore - I suggested to:  Patient Instructions  Please continue:  - Metformin ER 500 mg with b'fast and 1000 mg daily with supper - Glipizide 5 mg before b'fast  Please come back for a follow-up appointment in 3 months.   Please call and schedule an eye appt with Dr. Prudencio Burly: [facility]:  Dr. Sherlyn Lick MD ?  Address: [address], [place], [place] [address]  [phone]  - today, HbA1c is 7.7% (lower, but would have expected from her sugars at home >> may need a fructosamine next time) - continue checking sugars at different times of the day - check 1-2x a day, rotating checks - advised for yearly eye exams >> she is UTD - Return to clinic in 3 mo with sugar log    Philemon Kingdom, MD PhD [facility] Endocrinology

## 2016-10-13 MED FILL — SIMVASTATIN 10 MG TABLET: 10 | 30 days supply | Qty: 30 | Fill #1

## 2016-10-13 MED FILL — METFORMIN HCL ER 500 MG TAB: 500 | 90 days supply | Qty: 180 | Fill #2

## 2016-10-13 MED FILL — LISINOPRIL 2.5 MG TABLET: 2.5 | 30 days supply | Qty: 30 | Fill #1

## 2016-10-24 ENCOUNTER — Ambulatory Visit
Admission: RE | Admit: 2016-10-24 | Discharge: 2016-10-24 | Disposition: A | Discharge status: Home / Self Care | Payer: 59 | Source: Ambulatory Visit | Attending: Family Medicine | Admitting: Family Medicine

## 2016-10-24 DIAGNOSIS — Z1231 Encounter for screening mammogram for malignant neoplasm of breast: Secondary | ICD-10-CM

## 2016-11-22 ENCOUNTER — Other Ambulatory Visit: Payer: Self-pay | Admitting: Physician Assistant

## 2016-11-22 MED FILL — FLUTICASONE PROP 50 MCG SPR: 50 | 30 days supply | Qty: 16 | Fill #0

## 2016-11-22 MED FILL — ACCU-CHEK FASTCLIX LANCETS: 51 days supply | Qty: 102 | Fill #0

## 2016-11-22 MED FILL — PANTOPRAZOLE SOD DR 40 MG T: 40 | 30 days supply | Qty: 60 | Fill #0

## 2016-11-22 MED FILL — raNITIdine HCL 150 MG TABS: 150 | 90 days supply | Qty: 90 | Fill #0

## 2016-11-22 MED FILL — LISINOPRIL 2.5 MG TABLET: 2.5 | 30 days supply | Qty: 30 | Fill #2

## 2016-11-22 NOTE — Telephone Encounter (Signed)
Rx has been sent to the pharmacy electronically. ° °

## 2016-12-23 ENCOUNTER — Other Ambulatory Visit: Payer: Self-pay | Admitting: Internal Medicine

## 2016-12-23 MED FILL — glipiZIDE 5 MG TABS: 5 | 90 days supply | Qty: 90 | Fill #2

## 2016-12-23 MED FILL — LISINOPRIL 2.5 MG TABLET: 2.5 | 30 days supply | Qty: 30 | Fill #3

## 2016-12-23 MED FILL — SIMVASTATIN 10 MG TAB: 10 | 30 days supply | Qty: 30 | Fill #0

## 2016-12-23 MED FILL — DICLOFENAC SODIUM 1% GEL: 1 | 30 days supply | Qty: 100 | Fill #0

## 2016-12-28 ENCOUNTER — Other Ambulatory Visit: Payer: Self-pay

## 2016-12-28 MED FILL — METFORMIN HCL ER 500 MG TAB: 500 | 90 days supply | Qty: 270 | Fill #0

## 2016-12-28 MED FILL — PANTOPRAZOLE SOD DR 40 MG T: 40 | 90 days supply | Qty: 180 | Fill #1

## 2017-01-23 ENCOUNTER — Ambulatory Visit: Payer: 59 | Admitting: Internal Medicine

## 2017-01-31 ENCOUNTER — Other Ambulatory Visit: Payer: Self-pay | Admitting: Internal Medicine

## 2017-01-31 MED FILL — ACCU-CHEK GUIDE TEST STRIP: 90 days supply | Qty: 100 | Fill #0

## 2017-03-11 DIAGNOSIS — J0100 Acute maxillary sinusitis, unspecified: Secondary | ICD-10-CM | POA: Diagnosis not present

## 2017-03-27 DIAGNOSIS — H52223 Regular astigmatism, bilateral: Secondary | ICD-10-CM | POA: Diagnosis not present

## 2017-04-18 ENCOUNTER — Other Ambulatory Visit: Payer: Self-pay

## 2017-04-18 NOTE — Patient Outreach (Signed)
[facility]) Care Management  04/18/2017  RAJANAE MANTIA [birth year] [id number]   Case closed in [place].  Member is being followed by the [employer] program Member enrolled in an external program. Peter Garter RN, [facility] Care Management Coordinator-Link to [facility] [phone]

## 2017-04-24 ENCOUNTER — Ambulatory Visit: Payer: 59 | Admitting: Internal Medicine

## 2017-04-24 NOTE — Progress Notes (Deleted)
+Patient ID: Morgan Drake, female   DOB: 06/23/1970, 46 y.o.   MRN: [id number]  HPI: Morgan Drake is a 46 y.o.-year-old female, initially referred by Dr. [name] ([place], ), returning for f/u for DM2, dx as GDM in 1993 and 1998 and DM2 in 1999, non-insulin-dependent, uncontrolled, without complications. Last visit 6.5 months ago.  Last hemoglobin A1c was: Lab Results  Component Value Date   HGBA1C 7.7 10/10/2016   HGBA1C 8.7 05/30/2016   HGBA1C 7.9 02/22/2016  09/07/2015: 7.1%  Pt was on a regimen of: - Glyxambi 25-5 mg at bedtime (!) - Trulicity 1.5 mg weekly  She is now on: - Metformin ER thousand mg with dinner +  500 mg with b'fast - Glipizide 5 mg before b'fast  She stopped Invokana 100 mg in am, before b'fast >> stopped b/c repeated yeast infections. We had to stop Trulicity b/c nausea/vomiting. HbA1c increased afterwards She was on reg. Metformin >> tolerated it well for years, then started to have IBS - like sxs and had to stop. She was on Actos, Avandia. She was on Glipizide >> worked.  She refused insulin in the past.  Pt checks her sugars 1-2x a day: - am: 90-120 >> 110-111 >> 148-164 >> 90-128 - 2h after b'fast: n/c - before lunch:  60  >> 170-180s >> 110s - 2h after lunch: n/c - before dinner: 120-130 >> 125-145, 220 (grapes) - 2h after dinner: 130-140 >> 150s >> 170-179 - bedtime: 110-108 >> n/c - nighttime: n/c Lowest sugar was 60s >> 78 >> ***; she has hypoglycemia awareness at 90.  Highest sugar was 180s >> 179 >> ***  Glucometer: One Touch Ultra  Pt's meals are: - Breakfast: 1/2 biscuit + lettuce, tomatoes, bacon or bacon or sausage - Lunch: salad + meat  - Dinner: salad + meat + beans or baked potatoes - Snacks: 1: cheese nips or fruit  -No CKD, last BUN/creatinine:  Lab Results  Component Value Date   BUN 12 12/21/2015   BUN 10 09/27/2012   CREATININE 0.70 12/21/2015   CREATININE 0.64 09/27/2012  09/07/2015:  BUN/creatinine 10/0.78, EGFR 93. At that time, glucose 226. AST and ALT normal. On lisinopril. - + Hyperlipidemia; last set of lipids: 09/07/2015: 203/120/74/105 04/2015: 216/169/59/116 No results found for: CHOL, HDL, LDLCALC, LDLDIRECT, TRIG, CHOLHDL  On simvastatin. - last eye exam was in 10/2015: No DR -Dr Bing Plume. + small cataracts.  due to change in insurance, she could not return to see Dr. Bing Plume anymore.  -No numbness and tingling in her feet. She had this, but improved after giving up bread. 2 daughters have gluten sensitivity and 1 daughter with Chrohn ds.  On ASA 81.  She also has a history of fatty liver, IBS with both constipation and diarrhea, anxiety/depression, GERD, PCOS (she had to have fertility treatment when she got pregnant both times).  ROS: Constitutional: no weight gain/no weight loss, no fatigue, no subjective hyperthermia, no subjective hypothermia Eyes: no blurry vision, no xerophthalmia ENT: no sore throat, no nodules palpated in throat, no dysphagia, no odynophagia, no hoarseness Cardiovascular: no CP/no SOB/no palpitations/no leg swelling Respiratory: no cough/no SOB/no wheezing Gastrointestinal: no N/no V/no D/no C/no acid reflux Musculoskeletal: no muscle aches/no joint aches Skin: no rashes, no hair loss Neurological: no tremors/no numbness/no tingling/no dizziness  I reviewed pt's medications, allergies, PMH, social hx, family hx, and changes were documented in the history of present illness. Otherwise, unchanged from my initial visit note.   Past  Medical History:  Diagnosis Date  . Atopic dermatitis, mild   . Bell's palsy   . Diabetes mellitus without complication ([place])    type 2  . Diabetic peripheral neuropathy ([place])   . GI bleed   . Hiatal hernia   . Hyperlipidemia   . Hypertriglyceridemia    > 1000  . Metatarsal fracture   . Obesity   . PCOS (polycystic ovarian syndrome)   . Pericarditis 2008   Hospitalized at [facility] overnight, told  she had an MI, no cath. Thinks they said pericarditis   Past Surgical History:  Procedure Laterality Date  . ABDOMINAL HYSTERECTOMY    . CESAREAN SECTION    . CHOLECYSTECTOMY     Social History   Social History  . Marital Status: Single    Spouse Name: N/A  . Number of Children: 2   Occupational History  . RN  [employer]   Social History Main Topics  . Smoking status: Former Smoker -- 1.00 packs/day for 22 years    Types: Cigarettes  . Smokeless tobacco: Never Used     Comment: quit 2016  . Alcohol Use: 0.0 oz/week    0 Standard drinks or equivalent per week     Comment: rare  . Drug Use: No   Current Outpatient Medications on File Prior to Visit  Medication Sig Dispense Refill  . aspirin 81 MG tablet Take 81 mg by mouth daily.    . Cetirizine HCl (ZYRTEC ALLERGY) 10 MG CAPS Take 10 mg by mouth daily as needed.    . Cyanocobalamin (VITAMIN B-12) 3000 MCG SUBL Place under the tongue. Reported on 11/09/2015    . fluticasone (FLONASE) 50 MCG/ACT nasal spray Place 2 sprays into both nostrils daily.    Marland Kitchen glipiZIDE (GLUCOTROL) 5 MG tablet Take by mouth 5 mg before b'fast 90 tablet 3  . lisinopril (PRINIVIL,ZESTRIL) 2.5 MG tablet Take 2.5 mg by mouth daily.    . metFORMIN (GLUCOPHAGE-XR) 500 MG 24 hr tablet Take 1 tablet in am and 2 tablets with dinner 270 tablet 3  . pantoprazole (PROTONIX) 40 MG tablet TAKE 1 TABLET BY MOUTH TWICE DAILY BEFORE A MEAL 60 tablet 6  . ranitidine (ZANTAC) 150 MG tablet Take 150 mg by mouth daily.  2  . simvastatin (ZOCOR) 10 MG tablet Take 10 mg by mouth at bedtime.    . traZODone (DESYREL) 50 MG tablet Take 50 mg by mouth at bedtime as needed. Reported on 11/09/2015    . TRUE METRIX BLOOD GLUCOSE TEST test strip USE AS INSTRUCTED CHECK ONE TIME DAILY. 100 each 3  . TRUEPLUS LANCETS 30G MISC   6   No current facility-administered medications on file prior to visit.    Allergies  Allergen Reactions  . Magnesium Citrate Nausea And Vomiting  .  Cinnamon Other (See Comments)    Blisters in mouth  . Eggs Or Egg-Derived Products     sensitivity   . Flu Virus Vaccine   . Pineapple Other (See Comments)    Tongue feels like furry spikes    Family History  Problem Relation Age of Onset  . Lung cancer Father   . Hypertension Father   . Hyperlipidemia Father   . CAD Father   . Heart attack Father   . Diabetes Father   . Mesothelioma Father   . COPD Other   . Heart attack Brother   . Depression Mother   . Heart disease Mother   . Alcoholism Mother   .  Hyperlipidemia Mother   . Congestive Heart Failure Mother   . CAD Mother   . Anemia Mother   . GER disease Mother   . Rheum arthritis Mother   . CAD Maternal Grandmother   . Congestive Heart Failure Maternal Grandmother   . Diabetes Maternal Grandmother   . Heart attack Maternal Grandmother   . Breast cancer Maternal Grandmother   . Diabetes Paternal Grandmother   . Esophageal cancer Maternal Grandfather    PE: There were no vitals taken for this visit. Wt Readings from Last 3 Encounters:  10/10/16 239 lb (108.4 kg)  05/30/16 230 lb (104.3 kg)  02/22/16 230 lb (104.3 kg)   Constitutional: overweight, in NAD Eyes: PERRLA, EOMI, no exophthalmos ENT: moist mucous membranes, no thyromegaly, no cervical lymphadenopathy Cardiovascular: RRR, No MRG Respiratory: CTA B Gastrointestinal: abdomen soft, NT, ND, BS+ Musculoskeletal: no deformities, strength intact in all 4 Skin: moist, warm, no rashes Neurological: no tremor with outstretched hands, DTR normal in all 4  ASSESSMENT: 1. DM2, non-insulin-dependent, uncontrolled, without long term complications, but with hyperglycemia - we stopped Trulicity b/c N/V, Invokana b/c Yeast vaginitis  2. Obesity  PLAN:  1. Patient with long-standing, uncontrolled, diabetes in the past, improved after adding metformin ER and also glipizide before breakfast.  She also started to feel much better after the change in regimen. - At last  visit, she still had some hyperglycemia before and after dinner due to  eating fruit - I suggested to:  Patient Instructions  Please continue:  - Metformin ER 500 mg with breakfast and the thousand milligrams with supper - Glipizide 5 mg before b'fast  Please come back for a follow-up appointment in 3 months.  - today, HbA1c is 7%  - continue checking sugars at different times of the day - check 1x a day, rotating checks - advised for yearly eye exams >> she is UTD - Return to clinic in 3 mo with sugar log   2. Obesity  Philemon Kingdom, MD PhD [facility] End[facility]

## 2017-04-28 DIAGNOSIS — R300 Dysuria: Secondary | ICD-10-CM | POA: Diagnosis not present

## 2017-04-28 DIAGNOSIS — Z7689 Persons encountering health services in other specified circumstances: Secondary | ICD-10-CM | POA: Diagnosis not present

## 2017-04-28 DIAGNOSIS — R1084 Generalized abdominal pain: Secondary | ICD-10-CM | POA: Diagnosis not present

## 2017-04-28 DIAGNOSIS — N898 Other specified noninflammatory disorders of vagina: Secondary | ICD-10-CM | POA: Diagnosis not present

## 2017-05-18 MED FILL — ACCU-CHEK GUIDE TEST STRIP: 90 days supply | Qty: 100 | Fill #1

## 2017-05-18 MED FILL — ACCU-CHEK FASTCLIX LANCETS: 51 days supply | Qty: 102 | Fill #1

## 2017-05-18 MED FILL — METFORMIN HCL ER 500 MG TAB: 500 | 90 days supply | Qty: 270 | Fill #1

## 2017-05-18 MED FILL — PANTOPRAZOLE SOD DR 40 MG T: 40 | 90 days supply | Qty: 180 | Fill #2

## 2017-05-18 MED FILL — raNITIdine HCL 150 MG TABS: 150 | 90 days supply | Qty: 90 | Fill #1

## 2017-05-18 MED FILL — glipiZIDE 5 MG TABS: 5 | 90 days supply | Qty: 90 | Fill #3

## 2017-05-29 DIAGNOSIS — Z6841 Body Mass Index (BMI) 40.0 and over, adult: Secondary | ICD-10-CM | POA: Diagnosis not present

## 2017-05-29 DIAGNOSIS — Z1331 Encounter for screening for depression: Secondary | ICD-10-CM | POA: Diagnosis not present

## 2017-05-29 DIAGNOSIS — M793 Panniculitis, unspecified: Secondary | ICD-10-CM | POA: Diagnosis not present

## 2017-05-29 DIAGNOSIS — E781 Pure hyperglyceridemia: Secondary | ICD-10-CM | POA: Diagnosis not present

## 2017-05-29 DIAGNOSIS — I10 Essential (primary) hypertension: Secondary | ICD-10-CM | POA: Diagnosis not present

## 2017-05-29 DIAGNOSIS — B001 Herpesviral vesicular dermatitis: Secondary | ICD-10-CM | POA: Diagnosis not present

## 2017-05-29 DIAGNOSIS — J00 Acute nasopharyngitis [common cold]: Secondary | ICD-10-CM | POA: Diagnosis not present

## 2017-05-29 DIAGNOSIS — Z1339 Encounter for screening examination for other mental health and behavioral disorders: Secondary | ICD-10-CM | POA: Diagnosis not present

## 2017-05-29 DIAGNOSIS — E1142 Type 2 diabetes mellitus with diabetic polyneuropathy: Secondary | ICD-10-CM | POA: Diagnosis not present

## 2017-05-29 MED FILL — LISINOPRIL 2.5 MG TABLET: 2.5 | 90 days supply | Qty: 90 | Fill #0

## 2017-05-29 MED FILL — valACYclovir HCL 1 GM TABS: 1 | 15 days supply | Qty: 15 | Fill #0

## 2017-05-29 MED FILL — CEFDINIR 300 MG CAPSULE: 300 | 10 days supply | Qty: 20 | Fill #0

## 2017-05-29 MED FILL — FLUTICASONE PROP 50 MCG SPR: 50 | 60 days supply | Qty: 16 | Fill #0

## 2017-05-29 MED FILL — FLUCONAZOLE 150 MG TABLET: 150 | 6 days supply | Qty: 6 | Fill #0

## 2017-05-29 MED FILL — SIMVASTATIN 10 MG TAB: 10 | 90 days supply | Qty: 90 | Fill #0

## 2017-05-29 MED FILL — NYSTATIN 100,000 UNIT/GM CR: 100000 | 20 days supply | Qty: 60 | Fill #0

## 2017-06-01 ENCOUNTER — Encounter: Payer: Self-pay | Admitting: Internal Medicine

## 2017-06-01 NOTE — Progress Notes (Signed)
Received labs from PCP, drawn on 05/29/2017: CBC with differential normal CMP normal, except glucose 102.  BUN/creatinine: 10/0.8 Lipid panel: 244/424/47/n/c

## 2017-06-15 ENCOUNTER — Encounter: Payer: Self-pay | Admitting: Podiatry

## 2017-06-15 ENCOUNTER — Ambulatory Visit: Payer: 59 | Admitting: Podiatry

## 2017-06-15 ENCOUNTER — Ambulatory Visit (INDEPENDENT_AMBULATORY_CARE_PROVIDER_SITE_OTHER): Payer: 59

## 2017-06-15 ENCOUNTER — Other Ambulatory Visit: Payer: Self-pay | Admitting: Podiatry

## 2017-06-15 VITALS — BP 126/80 | HR 75 | Resp 16

## 2017-06-15 DIAGNOSIS — L03032 Cellulitis of left toe: Secondary | ICD-10-CM

## 2017-06-15 DIAGNOSIS — M79672 Pain in left foot: Secondary | ICD-10-CM | POA: Diagnosis not present

## 2017-06-15 DIAGNOSIS — S99922A Unspecified injury of left foot, initial encounter: Secondary | ICD-10-CM

## 2017-06-15 MED ORDER — CEPHALEXIN 500 MG PO CAPS: 500.0000 mg | ORAL_CAPSULE | Freq: Two times a day (BID) | ORAL | 1 refills | Status: DC

## 2017-06-15 NOTE — Progress Notes (Signed)
   Subjective:    Patient ID: Morgan Drake, female    DOB: 06-08-1970, 47 y.o.   MRN: [id number]  HPI    Review of Systems  All other systems reviewed and are negative.      Objective:   Physical Exam        Assessment & Plan:

## 2017-06-15 NOTE — Progress Notes (Signed)
Subjective:   Patient ID: Morgan Drake, female   DOB: 47 y.o.   MRN: [id number]   HPI Patient presents stating she started to develop swelling of her left big toe and is been very sore and she is a diabetic who has not been in control with her last A1c being 9.  Patient does not smoke and likes to be active and also has structural bunion deformity   Review of Systems  All other systems reviewed and are negative.       Objective:  Physical Exam  Constitutional: She appears well-developed and well-nourished.  Cardiovascular: Intact distal pulses.  Pulmonary/Chest: Effort normal.  Musculoskeletal: Normal range of motion.  Neurological: She is alert.  Skin: Skin is warm.  Nursing note and vitals reviewed.   Was within normal limits with redness at the base of the hallux medial side neurovascular status found to be intact muscle strength adequate range of motion and within the nail plate itself plantar with no proximal edema erythema or drainage noted at the current time.  Patient has good digital perfusion well oriented x3     Assessment:  Probability for paronychia infection of the left hallux medial side and into the central portion of the nail bed with possibility of trauma     Plan:  H&P and x-ray reviewed.  Discussed the diabetes and the possibility of infection that could also be associated with this.  Today I infiltrated the left hallux 60 mg like Marcaine mixture did sterile prep and using sterile instrumentation I opened up the medial side create a channel for drainage and then took an instrument across the plate of the nail to open it up and there is slight looseness of the nail but I was not able to find any areas with pus formation.  I flushed the area out applied sterile dressing instructed on soaks and as precautionary measure placed her on cephalexin 500 mg twice daily and gave strict instructions if any redness should occur systemic signs of infection to contact us  immediately  X-ray was negative for signs of contusion or lysis of the distal phalanx with moderate structural bunion deformity noted

## 2017-06-15 NOTE — Patient Instructions (Signed)

## 2017-07-17 ENCOUNTER — Ambulatory Visit (INDEPENDENT_AMBULATORY_CARE_PROVIDER_SITE_OTHER): Payer: 59 | Admitting: Internal Medicine

## 2017-07-17 ENCOUNTER — Encounter: Payer: Self-pay | Admitting: Internal Medicine

## 2017-07-17 VITALS — BP 108/64 | HR 80 | Ht 63.0 in | Wt 232.0 lb

## 2017-07-17 DIAGNOSIS — E1165 Type 2 diabetes mellitus with hyperglycemia: Secondary | ICD-10-CM

## 2017-07-17 DIAGNOSIS — H5213 Myopia, bilateral: Secondary | ICD-10-CM | POA: Diagnosis not present

## 2017-07-17 DIAGNOSIS — H35413 Lattice degeneration of retina, bilateral: Secondary | ICD-10-CM | POA: Diagnosis not present

## 2017-07-17 DIAGNOSIS — H53432 Sector or arcuate defects, left eye: Secondary | ICD-10-CM | POA: Diagnosis not present

## 2017-07-17 DIAGNOSIS — Q898 Other specified congenital malformations: Secondary | ICD-10-CM | POA: Diagnosis not present

## 2017-07-17 MED ORDER — METFORMIN HCL ER 500 MG PO TB24: ORAL_TABLET | ORAL | 3 refills | Status: DC

## 2017-07-17 NOTE — Patient Instructions (Addendum)
Please change: - Metformin ER 1500-2000 daily with supper - Glipizide 5 mg 2x a day  Please add Flax seed oil 2x a day.  Please stop at the lab.  Please come back for a follow-up appointment in 3 months.   Please call and schedule an eye appt with Dr. Prudencio Burly: [facility]:  Dr. Sherlyn Lick MD ?  Address: [address] [place], [place], [place] [address]  [phone]

## 2017-07-17 NOTE — Progress Notes (Addendum)
+Patient ID: Morgan Drake, female   DOB: [birth year], 47 y.o.   MRN: [id number]  HPI: Morgan Drake is a 46 y.o.-year-old female, initially referred by Dr. [name] ([place], [place]), returning for f/u for DM2, dx as GDM in 1993 and 1998 and DM2 in 1999, non-insulin-dependent, uncontrolled, without long-term complications. Last visit 9 mo ago.  She had a lot of stress in her life since last visit (daughter split from partner, OD'ed, she is keeping her children, which are disabled, also husband shut her out of the house). She missed med doses and sugars were higher. Now started to improve in last month - getting back on track: bought a house where she will be moving with her grandchildren.   Last hemoglobin A1c was: 05/29/2017: HbA1c 10.5% Lab Results  Component Value Date   HGBA1C 7.7 10/10/2016   HGBA1C 8.7 05/30/2016   HGBA1C 7.9 02/22/2016  09/07/2015: 7.1%  Pt was on a regimen of: - Glyxambi 25-5 mg at bedtime (!) - Trulicity 1.5 mg weekly  She is now on: but skipped many doses: - Metformin ER 1000 mg with dinner + 500 mg with b'fast - dose increased 05/2015 - Glipizide 5 mg before b'fast - added 05/2015. She stopped Invokana 100 mg in am, before b'fast >> stopped b/c repeated yeast infections. We had to stop Trulicity b/c nausea/vomiting. HbA1c increased afterwards She was on reg. Metformin >> tolerated it well for years, then started to have IBS - like sxs and had to stop. She was on Actos, Avandia. She was on Glipizide >> worked.  She refused insulin in the past.  Pt checks her sugars 1-2 times a day - better since last HbA1c: - am: 148-164 >> 90-128 >> 150-180 - 2h after b'fast: n/c - before lunch:  170-180s >> 110s >> 80-100 - 2h after lunch: n/c - before dinner: 120-130 >> 125-145, 220 (grapes) >> 120-130s - 2h after dinner: 150s >> 170-179 >> <180 - bedtime: 110-108 >> n/c - nighttime: n/c Lowest sugar was 60s >> 78 >> 80; she has  hypoglycemia awareness in the 80s. Highest sugar was 180s >> 179 >> 180.  Glucometer: One Touch Ultra  Pt's meals are: - Breakfast: 1/2 biscuit + lettuce, tomatoes, bacon or bacon or sausage - Lunch: salad + meat  - Dinner: salad + meat + beans or baked potatoes - Snacks: 1: cheese nips or fruit  - No CKD, last BUN/creatinine:  05/29/2017:CMP normal, except glucose 102.  BUN/creatinine: 10/0.8 Lab Results  Component Value Date   BUN 12 12/21/2015   BUN 10 09/27/2012   CREATININE 0.70 12/21/2015   CREATININE 0.64 09/27/2012  09/07/2015: BUN/creatinine 10/0.78, EGFR 93. At that time, glucose 226. AST and ALT normal. On lisinopril.  - + HTG; Ast set of lipids: 05/29/2017: 244/424/47/n/c 09/07/2015: 203/120/74/105 04/2015: 216/169/59/116 No results found for: CHOL, HDL, LDLCALC, LDLDIRECT, TRIG, CHOLHDL  On simvastatin.  - last eye exam was in 10/2015: No DR.  + Small cataract.  Dr Bing Plume who does not take her insurance anymore.  - No numbness and tingling in her feet. She had this, but improved after giving up bread. 2 daughters have gluten sensitivity and 1 daughter with Chrohn ds.  On ASA 81.  She also has a history of fatty liver, IBS with both constipation and diarrhea, anxiety/depression, GERD, PCOS (she had to have fertility treatment when she got pregnant both times).  ROS: Constitutional: no weight gain/no weight loss, no fatigue, + hot flushes, no  subjective hypothermia Eyes: no blurry vision, no xerophthalmia ENT: no sore throat, no nodules palpated in throat, no dysphagia, no odynophagia, no hoarseness Cardiovascular: no CP/no SOB/no palpitations/no leg swelling Respiratory: no cough/no SOB/no wheezing Gastrointestinal: + N/no V/+ D/+ C/no acid reflux Musculoskeletal: no muscle aches/+ joint aches - hands Skin: no rashes, + hair thinning Neurological: no tremors/no numbness/no tingling/no dizziness + anxiety  I reviewed pt's medications, allergies, PMH, social  hx, family hx, and changes were documented in the history of present illness. Otherwise, unchanged from my initial visit note. Stopped B12.   Past Medical History:  Diagnosis Date  . Atopic dermatitis, mild   . Bell's palsy   . Diabetes mellitus without complication ([place])    type 2  . Diabetic peripheral neuropathy ([place])   . GI bleed   . Hiatal hernia   . Hyperlipidemia   . Hypertriglyceridemia    > 1000  . Metatarsal fracture   . Obesity   . PCOS (polycystic ovarian syndrome)   . Pericarditis 2008   Hospitalized at [facility] overnight, told she had an MI, no cath. Thinks they said pericarditis   Past Surgical History:  Procedure Laterality Date  . ABDOMINAL HYSTERECTOMY    . CESAREAN SECTION    . CHOLECYSTECTOMY     Social History   Social History  . Marital Status: Single    Spouse Name: N/A  . Number of Children: 2   Occupational History  . RN  [employer]   Social History Main Topics  . Smoking status: Former Smoker -- 1.00 packs/day for 22 years    Types: Cigarettes  . Smokeless tobacco: Never Used     Comment: quit 2016  . Alcohol Use: 0.0 oz/week    0 Standard drinks or equivalent per week     Comment: rare  . Drug Use: No   Current Outpatient Medications on File Prior to Visit  Medication Sig Dispense Refill  . aspirin 81 MG tablet Take 81 mg by mouth daily.    . Cetirizine HCl (ZYRTEC ALLERGY) 10 MG CAPS Take 10 mg by mouth daily as needed.    . fluticasone (FLONASE) 50 MCG/ACT nasal spray Place 2 sprays into both nostrils daily.    Marland Kitchen glipiZIDE (GLUCOTROL) 5 MG tablet Take by mouth 5 mg before b'fast 90 tablet 3  . lisinopril (PRINIVIL,ZESTRIL) 2.5 MG tablet Take 2.5 mg by mouth daily.    . metFORMIN (GLUCOPHAGE-XR) 500 MG 24 hr tablet Take 1 tablet in am and 2 tablets with dinner 270 tablet 3  . pantoprazole (PROTONIX) 40 MG tablet TAKE 1 TABLET BY MOUTH TWICE DAILY BEFORE A MEAL 60 tablet 6  . ranitidine (ZANTAC) 150 MG tablet Take 150 mg by mouth  daily.  2  . simvastatin (ZOCOR) 10 MG tablet Take 10 mg by mouth at bedtime.    . TRUE METRIX BLOOD GLUCOSE TEST test strip USE AS INSTRUCTED CHECK ONE TIME DAILY. 100 each 3  . TRUEPLUS LANCETS 30G MISC   6   No current facility-administered medications on file prior to visit.    Allergies  Allergen Reactions  . Magnesium Citrate Nausea And Vomiting  . Cinnamon Other (See Comments)    Blisters in mouth  . Eggs Or Egg-Derived Products     sensitivity   . Flu Virus Vaccine   . Pineapple Other (See Comments)    Tongue feels like furry spikes    Family History  Problem Relation Age of Onset  . Lung  cancer Father   . Hypertension Father   . Hyperlipidemia Father   . CAD Father   . Heart attack Father   . Diabetes Father   . Mesothelioma Father   . COPD Other   . Heart attack Brother   . Depression Mother   . Heart disease Mother   . Alcoholism Mother   . Hyperlipidemia Mother   . Congestive Heart Failure Mother   . CAD Mother   . Anemia Mother   . GER disease Mother   . Rheum arthritis Mother   . CAD Maternal Grandmother   . Congestive Heart Failure Maternal Grandmother   . Diabetes Maternal Grandmother   . Heart attack Maternal Grandmother   . Breast cancer Maternal Grandmother   . Diabetes Paternal Grandmother   . Esophageal cancer Maternal Grandfather    PE: BP 108/64   Pulse 80   Ht '5\' 3"'$  (1.6 m)   Wt 232 lb (105.2 kg)   SpO2 97%   BMI 41.10 kg/m  Wt Readings from Last 3 Encounters:  07/17/17 232 lb (105.2 kg)  10/10/16 239 lb (108.4 kg)  05/30/16 230 lb (104.3 kg)   Constitutional: overweight, in NAD Eyes: PERRLA, EOMI, no exophthalmos ENT: moist mucous membranes, no thyromegaly, no cervical lymphadenopathy Cardiovascular: RRR, No MRG Respiratory: CTA B Gastrointestinal: abdomen soft, NT, ND, BS+ Musculoskeletal: no deformities, strength intact in all 4 Skin: moist, warm, no rashes Neurological: no tremor with outstretched hands, DTR normal in all  4  ASSESSMENT: 1. DM2, non-insulin-dependent, uncontrolled, without long term complications, but with hyperglycemia - we stopped Trulicity b/c N/V, Invokana b/c Yeast vaginitis  2. HTG  PLAN:  1. Patient with long-standing, uncontrolled, type 2 diabetes, improved at last visit, but greatly worsened since, due to increased stress at home.  She was missing many doses of her medications and was almost homeless.  She is now getting slowly back under control, and she feels that her sugars did improve since she had her last HbA1c checked in 05/2017.   - Sugars are still high in the morning, so I advised her to try to move all the metformin at night - We will also increase the glipizide to twice a day before meals - Again advised her to schedule annual eye exam - I suggested to:  Patient Instructions  Please change: - Metformin ER 1500-2000 daily with supper - Glipizide 5 mg 2x a day  Please add Flax seed oil 2x a day.  Please stop at the lab.  Please come back for a follow-up appointment in 3 months.   Please call and schedule an eye appt with Dr. Prudencio Burly: [facility]:  Dr. Sherlyn Lick MD ?  Address: [address], [place], [place] [address]  [phone]  - reviewed together last HbA1c which was 10.5% at last visit with PCP 1.5 mo ago. - continue checking sugars at different times of the day - check 1x a day, rotating checks - advised for yearly eye exams >> she is not UTD - Return to clinic in 3 mo with sugar log   2. HTG - reviewed latest Lipid panel per PCP records (05/2017): TGs in the 400s. She continues on Simvastatin w/o SEs - I also suggested to add Flax seed oil 2x a day  Office Visit on 07/17/2017  Component Date Value Ref Range Status  . Fructosamine 07/17/2017 354* 190 - 270 umol/L Final   HbA1c calculated from fructosamine is much better: 7.6%!  Salena Saner  Cruzita Lederer, MD PhD [facility]

## 2017-07-18 LAB — FRUCTOSAMINE: Fructosamine: 354 umol/L — ABNORMAL HIGH (ref 190–270)

## 2017-07-29 MED FILL — METFORMIN HCL ER 500 MG TAB: 500 | 90 days supply | Qty: 360 | Fill #0

## 2017-08-02 ENCOUNTER — Other Ambulatory Visit: Payer: Self-pay | Admitting: Internal Medicine

## 2017-08-04 DIAGNOSIS — H5213 Myopia, bilateral: Secondary | ICD-10-CM | POA: Diagnosis not present

## 2017-08-08 DIAGNOSIS — M25512 Pain in left shoulder: Secondary | ICD-10-CM | POA: Diagnosis not present

## 2017-08-08 DIAGNOSIS — M25522 Pain in left elbow: Secondary | ICD-10-CM | POA: Diagnosis not present

## 2017-08-08 DIAGNOSIS — Z6841 Body Mass Index (BMI) 40.0 and over, adult: Secondary | ICD-10-CM | POA: Diagnosis not present

## 2017-08-09 DIAGNOSIS — M7712 Lateral epicondylitis, left elbow: Secondary | ICD-10-CM | POA: Diagnosis not present

## 2017-08-09 DIAGNOSIS — M25512 Pain in left shoulder: Secondary | ICD-10-CM | POA: Diagnosis not present

## 2017-08-23 DIAGNOSIS — M2550 Pain in unspecified joint: Secondary | ICD-10-CM | POA: Diagnosis not present

## 2017-08-25 DIAGNOSIS — E875 Hyperkalemia: Secondary | ICD-10-CM | POA: Diagnosis not present

## 2017-08-28 DIAGNOSIS — R599 Enlarged lymph nodes, unspecified: Secondary | ICD-10-CM | POA: Diagnosis not present

## 2017-09-01 DIAGNOSIS — Z3009 Encounter for other general counseling and advice on contraception: Secondary | ICD-10-CM | POA: Diagnosis not present

## 2017-09-06 DIAGNOSIS — Z803 Family history of malignant neoplasm of breast: Secondary | ICD-10-CM | POA: Diagnosis not present

## 2017-09-06 DIAGNOSIS — E282 Polycystic ovarian syndrome: Secondary | ICD-10-CM | POA: Diagnosis not present

## 2017-09-06 DIAGNOSIS — Z3009 Encounter for other general counseling and advice on contraception: Secondary | ICD-10-CM | POA: Diagnosis not present

## 2017-09-11 ENCOUNTER — Other Ambulatory Visit: Payer: Self-pay | Admitting: Family Medicine

## 2017-09-11 DIAGNOSIS — Z6841 Body Mass Index (BMI) 40.0 and over, adult: Secondary | ICD-10-CM | POA: Diagnosis not present

## 2017-09-11 DIAGNOSIS — Z1231 Encounter for screening mammogram for malignant neoplasm of breast: Secondary | ICD-10-CM

## 2017-09-11 DIAGNOSIS — M2550 Pain in unspecified joint: Secondary | ICD-10-CM | POA: Diagnosis not present

## 2017-09-18 DIAGNOSIS — Z8261 Family history of arthritis: Secondary | ICD-10-CM | POA: Diagnosis not present

## 2017-09-18 DIAGNOSIS — M791 Myalgia, unspecified site: Secondary | ICD-10-CM | POA: Diagnosis not present

## 2017-09-18 DIAGNOSIS — M79641 Pain in right hand: Secondary | ICD-10-CM | POA: Diagnosis not present

## 2017-09-18 DIAGNOSIS — Z6841 Body Mass Index (BMI) 40.0 and over, adult: Secondary | ICD-10-CM | POA: Diagnosis not present

## 2017-09-18 DIAGNOSIS — M2550 Pain in unspecified joint: Secondary | ICD-10-CM | POA: Diagnosis not present

## 2017-09-18 DIAGNOSIS — M79642 Pain in left hand: Secondary | ICD-10-CM | POA: Diagnosis not present

## 2017-09-18 DIAGNOSIS — M7989 Other specified soft tissue disorders: Secondary | ICD-10-CM | POA: Diagnosis not present

## 2017-09-18 MED FILL — MELOXICAM 15 MG TABLET: 15 | 30 days supply | Qty: 30 | Fill #0

## 2017-09-20 DIAGNOSIS — Q898 Other specified congenital malformations: Secondary | ICD-10-CM | POA: Diagnosis not present

## 2017-09-20 DIAGNOSIS — I77810 Thoracic aortic ectasia: Secondary | ICD-10-CM | POA: Diagnosis not present

## 2017-09-20 DIAGNOSIS — R55 Syncope and collapse: Secondary | ICD-10-CM | POA: Diagnosis not present

## 2017-09-22 DIAGNOSIS — I77810 Thoracic aortic ectasia: Secondary | ICD-10-CM | POA: Diagnosis not present

## 2017-09-22 DIAGNOSIS — Q898 Other specified congenital malformations: Secondary | ICD-10-CM | POA: Diagnosis not present

## 2017-09-22 DIAGNOSIS — R55 Syncope and collapse: Secondary | ICD-10-CM | POA: Diagnosis not present

## 2017-09-27 MED FILL — glipiZIDE 5 MG TABS: 5 | 90 days supply | Qty: 180 | Fill #0

## 2017-09-28 DIAGNOSIS — Z30432 Encounter for removal of intrauterine contraceptive device: Secondary | ICD-10-CM | POA: Diagnosis not present

## 2017-09-28 DIAGNOSIS — Q898 Other specified congenital malformations: Secondary | ICD-10-CM | POA: Diagnosis not present

## 2017-09-28 DIAGNOSIS — N921 Excessive and frequent menstruation with irregular cycle: Secondary | ICD-10-CM | POA: Diagnosis not present

## 2017-09-28 DIAGNOSIS — Z01818 Encounter for other preprocedural examination: Secondary | ICD-10-CM | POA: Diagnosis not present

## 2017-09-28 DIAGNOSIS — Z975 Presence of (intrauterine) contraceptive device: Secondary | ICD-10-CM | POA: Diagnosis not present

## 2017-10-10 DIAGNOSIS — Q898 Other specified congenital malformations: Secondary | ICD-10-CM | POA: Diagnosis not present

## 2017-10-10 DIAGNOSIS — Z803 Family history of malignant neoplasm of breast: Secondary | ICD-10-CM | POA: Diagnosis not present

## 2017-10-10 DIAGNOSIS — Z8041 Family history of malignant neoplasm of ovary: Secondary | ICD-10-CM | POA: Diagnosis not present

## 2017-10-10 DIAGNOSIS — E282 Polycystic ovarian syndrome: Secondary | ICD-10-CM | POA: Diagnosis not present

## 2017-10-12 DIAGNOSIS — Z975 Presence of (intrauterine) contraceptive device: Secondary | ICD-10-CM | POA: Diagnosis not present

## 2017-10-12 DIAGNOSIS — Q870 Congenital malformation syndromes predominantly affecting facial appearance: Secondary | ICD-10-CM | POA: Diagnosis not present

## 2017-10-12 DIAGNOSIS — E669 Obesity, unspecified: Secondary | ICD-10-CM | POA: Diagnosis not present

## 2017-10-12 DIAGNOSIS — G4733 Obstructive sleep apnea (adult) (pediatric): Secondary | ICD-10-CM | POA: Diagnosis not present

## 2017-10-12 DIAGNOSIS — Z302 Encounter for sterilization: Secondary | ICD-10-CM | POA: Diagnosis not present

## 2017-10-12 DIAGNOSIS — L680 Hirsutism: Secondary | ICD-10-CM | POA: Diagnosis not present

## 2017-10-12 DIAGNOSIS — Q898 Other specified congenital malformations: Secondary | ICD-10-CM | POA: Diagnosis not present

## 2017-10-12 DIAGNOSIS — N854 Malposition of uterus: Secondary | ICD-10-CM | POA: Diagnosis not present

## 2017-10-16 ENCOUNTER — Ambulatory Visit (INDEPENDENT_AMBULATORY_CARE_PROVIDER_SITE_OTHER): Payer: 59 | Admitting: Internal Medicine

## 2017-10-16 ENCOUNTER — Encounter: Payer: Self-pay | Admitting: Internal Medicine

## 2017-10-16 VITALS — BP 110/70 | HR 77 | Ht 63.0 in | Wt 229.0 lb

## 2017-10-16 DIAGNOSIS — E1165 Type 2 diabetes mellitus with hyperglycemia: Secondary | ICD-10-CM

## 2017-10-16 DIAGNOSIS — E781 Pure hyperglyceridemia: Secondary | ICD-10-CM | POA: Diagnosis not present

## 2017-10-16 LAB — POCT GLYCOSYLATED HEMOGLOBIN (HGB A1C): HEMOGLOBIN A1C: 9.2 % — AB (ref 4.0–5.6)

## 2017-10-16 NOTE — Addendum Note (Signed)
Addended by: Drucilla Schmidt on: 10/16/2017 10:43 AM   Modules accepted: Orders

## 2017-10-16 NOTE — Progress Notes (Addendum)
+Patient ID: Morgan Drake, female   DOB: 1971/04/15, 47 y.o.   MRN: [id number]  HPI: Morgan Drake is a 47 y.o.-year-old female, initially referred by Dr. [name] ([place], [place]), returning for f/u for DM2, dx as GDM in 1993 and 1998 and DM2 in 1999, non-insulin-dependent, uncontrolled, without long-term complications. Last visit 3 mo ago.  At last visit she had a lot of stress in her life since last visit (daughter split from partner, OD'ed, she is keeping her children, which are disabled, also husband shut her out of the house). She missed med doses and sugars were higher.  Right before I saw her in 07/2017, she was getting back on track: Bought a house where she will move with her grandchildren.   Sugars started to improve since then.  She is also adjusting her diet by cutting out late eating and adjusting her sweets intake.  Last hemoglobin A1c was: 07/17/2017: HbA1c calculated from fructosamine is much better: 7.6%! 05/29/2017: HbA1c 10.5% Lab Results  Component Value Date   HGBA1C 7.7 10/10/2016   HGBA1C 8.7 05/30/2016   HGBA1C 7.9 02/22/2016  09/07/2015: 7.1%  Pt was on a regimen of: - Glyxambi 25-5 mg at bedtime (!) - Trulicity 1.5 mg weekly  Currently on:  - Metformin ER 1000 mg with dinner + 500 mg with b'fast - dose increased 05/2015 >> 1500-2000 mg with dinner - Glipizide 5 mg before b'fast - added 05/2015 >> 5 mg 2x a day before meals She stopped Invokana 100 mg in am, before b'fast >> stopped b/c repeated yeast infections. We had to stop Trulicity b/c nausea/vomiting. HbA1c increased afterwards She was on reg. Metformin >> tolerated it well for years, then started to have IBS - like sxs and had to stop. She was on Actos, Avandia. She was on Glipizide >> worked.  She refused insulin in the past.  Pt checks her sugars 1-2X a day: - am: 90-128 >> 150-180 >> 158-160s, 226 (ate out the night before) - 2h after b'fast: n/c >> 300s after creamer -  stopped! - before lunch:  170-180s >> 110s >> 80-100 >> 85, 110-114 - 2h after lunch: n/c - before dinner: 125-145, 220 (grapes) >> 120-130s >> 115-120s - 2h after dinner: 150s >> 170-179 >> <180 >> 150-160s - bedtime: 110-108 >> n/c - nighttime: n/c Lowest sugar was 80 >> 85; she has hypoglycemia awareness in the 80s. Highest sugar was 180 >> 326.  Glucometer: One Touch Ultra  Pt's meals are: - Breakfast: 1/2 biscuit + lettuce, tomatoes, bacon or bacon or sausage - Lunch: salad + meat  - Dinner: salad + meat + beans or baked potatoes - Snacks: 1: cheese nips or fruit  - No CKD, last BUN/creatinine:  05/29/2017:CMP normal, except glucose 102.  BUN/creatinine: 10/0.8 Lab Results  Component Value Date   BUN 12 12/21/2015   BUN 10 09/27/2012   CREATININE 0.70 12/21/2015   CREATININE 0.64 09/27/2012  09/07/2015: BUN/creatinine 10/0.78, EGFR 93. At that time, glucose 226. AST and ALT normal. On  lisinopril.  -  + Hypertriglyceridemia; last set of lipids: 05/29/2017: 244/424/47/n/c 09/07/2015: 203/120/74/105 04/2015: 216/169/59/116 No results found for: CHOL, HDL, LDLCALC, LDLDIRECT, TRIG, CHOLHDL  On simvastatin.  At last, I advised her to add flaxseed oil twice a day  - last eye exam was in 10/2015: No DR; + small cataract.  She is to see Dr Bing Plume, but he does not take her insurance anymore.  She did not schedule an  appointment with Dr. Prudencio Burly yet.  - Denies numbness and tingling in her feet. She had this, but improved after giving up bread. 2 daughters have gluten sensitivity and 1 daughter with Chrohn ds.  On ASA 81.  She also has a history of fatty liver, IBS with both constipation and diarrhea, anxiety/depression, GERD, PCOS (she had to have fertility treatment when she got pregnant both times).  She is seeing Dr. Gavin Pound with rheumatology for inflammatory arthritis.  ROS: Constitutional: no weight gain/no weight loss, no fatigue, no subjective hyperthermia, no  subjective hypothermia Eyes: no blurry vision, no xerophthalmia ENT: no sore throat, no nodules palpated in throat, no dysphagia, no odynophagia, no hoarseness Cardiovascular: no CP/no SOB/no palpitations/no leg swelling Respiratory: no cough/no SOB/no wheezing Gastrointestinal: no N/no V/no D/no C/no acid reflux Musculoskeletal: no muscle aches/+ joint aches - on Meloxicam Skin: no rashes, no hair loss Neurological: no tremors/no numbness/no tingling/no dizziness  I reviewed pt's medications, allergies, PMH, social hx, family hx, and changes were documented in the history of present illness. Otherwise, unchanged from my initial visit note.  Past Medical History:  Diagnosis Date  . Atopic dermatitis, mild   . Bell's palsy   . Diabetes mellitus without complication ([place])    type 2  . Diabetic peripheral neuropathy ([place])   . GI bleed   . Hiatal hernia   . Hyperlipidemia   . Hypertriglyceridemia    > 1000  . Metatarsal fracture   . Obesity   . PCOS (polycystic ovarian syndrome)   . Pericarditis 2008   Hospitalized at [facility] overnight, told she had an MI, no cath. Thinks they said pericarditis   Past Surgical History:  Procedure Laterality Date  . ABDOMINAL HYSTERECTOMY    . CESAREAN SECTION    . CHOLECYSTECTOMY     Social History   Social History  . Marital Status: Single    Spouse Name: N/A  . Number of Children: 2   Occupational History  . RN  [employer]   Social History Main Topics  . Smoking status: Former Smoker -- 1.00 packs/day for 22 years    Types: Cigarettes  . Smokeless tobacco: Never Used     Comment: quit 2016  . Alcohol Use: 0.0 oz/week    0 Standard drinks or equivalent per week     Comment: rare  . Drug Use: No   Current Outpatient Medications on File Prior to Visit  Medication Sig Dispense Refill  . aspirin 81 MG tablet Take 81 mg by mouth daily.    . Cetirizine HCl (ZYRTEC ALLERGY) 10 MG CAPS Take 10 mg by mouth daily as needed.    .  fluticasone (FLONASE) 50 MCG/ACT nasal spray Place 2 sprays into both nostrils daily.    Marland Kitchen glipiZIDE (GLUCOTROL) 5 MG tablet TAKE 1 TABLET BY MOUTH DAILY BEFORE BREAKFAST 90 tablet 3  . lisinopril (PRINIVIL,ZESTRIL) 2.5 MG tablet Take 2.5 mg by mouth daily.    . metFORMIN (GLUCOPHAGE-XR) 500 MG 24 hr tablet Take 4 tablets with dinner 360 tablet 3  . pantoprazole (PROTONIX) 40 MG tablet TAKE 1 TABLET BY MOUTH TWICE DAILY BEFORE A MEAL 60 tablet 6  . ranitidine (ZANTAC) 150 MG tablet Take 150 mg by mouth daily.  2  . simvastatin (ZOCOR) 10 MG tablet Take 10 mg by mouth at bedtime.    . TRUE METRIX BLOOD GLUCOSE TEST test strip USE AS INSTRUCTED CHECK ONE TIME DAILY. 100 each 3  . TRUEPLUS LANCETS 30G MISC  6   No current facility-administered medications on file prior to visit.    Allergies  Allergen Reactions  . Magnesium Citrate Nausea And Vomiting  . Cinnamon Other (See Comments)    Blisters in mouth  . Eggs Or Egg-Derived Products     sensitivity   . Flu Virus Vaccine   . Pineapple Other (See Comments)    Tongue feels like furry spikes    Family History  Problem Relation Age of Onset  . Lung cancer Father   . Hypertension Father   . Hyperlipidemia Father   . CAD Father   . Heart attack Father   . Diabetes Father   . Mesothelioma Father   . COPD Other   . Heart attack Brother   . Depression Mother   . Heart disease Mother   . Alcoholism Mother   . Hyperlipidemia Mother   . Congestive Heart Failure Mother   . CAD Mother   . Anemia Mother   . GER disease Mother   . Rheum arthritis Mother   . CAD Maternal Grandmother   . Congestive Heart Failure Maternal Grandmother   . Diabetes Maternal Grandmother   . Heart attack Maternal Grandmother   . Breast cancer Maternal Grandmother   . Diabetes Paternal Grandmother   . Esophageal cancer Maternal Grandfather    PE: BP 110/70   Pulse 77   Ht _0  (1.6 m)   Wt 229 lb (103.9 kg)   SpO2 97%   BMI 40.57 kg/m  Wt Readings  from Last 3 Encounters:  10/16/17 229 lb (103.9 kg)  07/17/17 232 lb (105.2 kg)  10/10/16 239 lb (108.4 kg)   Constitutional: overweight, in NAD Eyes: PERRLA, EOMI, no exophthalmos ENT: moist mucous membranes, no thyromegaly, no cervical lymphadenopathy Cardiovascular: RRR, No MRG Respiratory: CTA B Gastrointestinal: abdomen soft, NT, ND, BS+ Musculoskeletal: no deformities, strength intact in all 4 Skin: moist, warm, no rashes Neurological: no tremor with outstretched hands, DTR normal in all 4  ASSESSMENT: 1. DM2, non-insulin-dependent, uncontrolled, without long term complications, but with hyperglycemia - we stopped Trulicity b/c N/V, Invokana b/c Yeast vaginitis  2. HTG  3. Obesity class 3  PLAN:  1. Patient with long-standing, uncontrolled, type 2 diabetes, greatly worsened at last visit due to increased stress at home.  She was missing many medication doses and was almost homeless.  She started to get things back under control and her sugars started to improve  - At last visit, we increased her glipizide to twice a day before meals, also as her sugars were high in the morning, we moved to the entire metformin ER dose at dinnertime - At this visit, sugars are approximately the same as before, with slight improvement in the morning.  She is working on her diet and we discussed about things that have the most impact on improving her sugars.  She will try to start eating after 5-6 PM, not put creamer in her coffee, and eat less processed foods.   - No changes are needed in her regimen for now, since there is an improvement in her HbA1c at this visit compared to 05/2017 - today, HbA1c is 9.2% (improved), but we will check a fructosamine since her sugars in her log are non-congruent with her measured HbA1c - We reviewed her HbA1c from last visit.  This was 7.6%, as calculated from fructosamine - I suggested to:  Patient Instructions  Please continue: - Metformin ER 1500-2000 mg  daily with supper - Glipizide  5 mg 2x a day  Please continue flaxseed oil  2x a day.  Please come back for a follow-up appointment in 3 months  Please call and schedule an eye appt with Dr. Prudencio Burly: [facility]:  Dr. Sherlyn Lick MD ?  Address: [address], [place], [place] [address]  [phone]  - continue checking sugars at different times of the day - check 1x a day, rotating checks - Again advised her to schedule an annual eye exam as she is not UTD - Return to clinic in 3 mo with sugar log    2. HTG -Reviewed latest lipid panel per PCP records (05/2017): Triglycerides in the 400s. -She continues on simvastatin without side effects -At last visit, I advised her to add flaxseed oil twice a day >> she started this -Improving her diet will also help  3. Obesity class 3 -She lost 3 pounds since last visit, 10 pounds in the last year -She is adjusting her diet to lose more weight  Office Visit on 10/16/2017  Component Date Value Ref Range Status  . Fructosamine 10/16/2017 329* 190 - 270 umol/L Final  . Hemoglobin A1C 10/16/2017 9.2* 4.0 - 5.6 % Final   HbA1c calculated from fructosamine is much lower than the measured one: 7.2%!  Philemon Kingdom, MD PhD [facility]

## 2017-10-16 NOTE — Patient Instructions (Signed)
Please continue: - Metformin ER 1500-2000 daily with supper - Glipizide 5 mg 2x a day  Please continue Flax seed oil 2x a day.  Please come back for a follow-up appointment in 3 months.   Please call and schedule an eye appt with Dr. Prudencio Burly: [facility]:  Dr. Sherlyn Lick MD ?  Address: [address] [place], [place], [place] [address]  [phone]

## 2017-10-18 LAB — FRUCTOSAMINE: FRUCTOSAMINE: 329 umol/L — AB (ref 190–270)

## 2017-10-23 DIAGNOSIS — M2550 Pain in unspecified joint: Secondary | ICD-10-CM | POA: Diagnosis not present

## 2017-10-23 DIAGNOSIS — M791 Myalgia, unspecified site: Secondary | ICD-10-CM | POA: Diagnosis not present

## 2017-10-23 DIAGNOSIS — M0600 Rheumatoid arthritis without rheumatoid factor, unspecified site: Secondary | ICD-10-CM | POA: Diagnosis not present

## 2017-10-23 DIAGNOSIS — M7989 Other specified soft tissue disorders: Secondary | ICD-10-CM | POA: Diagnosis not present

## 2017-10-23 DIAGNOSIS — Z8261 Family history of arthritis: Secondary | ICD-10-CM | POA: Diagnosis not present

## 2017-10-23 DIAGNOSIS — Z6841 Body Mass Index (BMI) 40.0 and over, adult: Secondary | ICD-10-CM | POA: Diagnosis not present

## 2017-10-26 ENCOUNTER — Ambulatory Visit
Admission: RE | Admit: 2017-10-26 | Discharge: 2017-10-26 | Disposition: A | Discharge status: Home / Self Care | Payer: 59 | Source: Ambulatory Visit | Attending: Family Medicine | Admitting: Family Medicine

## 2017-10-26 DIAGNOSIS — Z9889 Other specified postprocedural states: Secondary | ICD-10-CM | POA: Diagnosis not present

## 2017-10-26 DIAGNOSIS — Z1231 Encounter for screening mammogram for malignant neoplasm of breast: Secondary | ICD-10-CM

## 2017-10-30 ENCOUNTER — Ambulatory Visit: Payer: 59

## 2017-11-02 DIAGNOSIS — Z6841 Body Mass Index (BMI) 40.0 and over, adult: Secondary | ICD-10-CM | POA: Diagnosis not present

## 2017-11-02 DIAGNOSIS — Z1339 Encounter for screening examination for other mental health and behavioral disorders: Secondary | ICD-10-CM | POA: Diagnosis not present

## 2017-11-02 DIAGNOSIS — Z0000 Encounter for general adult medical examination without abnormal findings: Secondary | ICD-10-CM | POA: Diagnosis not present

## 2017-11-02 MED FILL — raNITIdine HCL 150 MG TABS: 150 | 90 days supply | Qty: 90 | Fill #0

## 2017-11-02 MED FILL — FUROSEMIDE 20 MG TABS: 20 | 90 days supply | Qty: 90 | Fill #0

## 2017-11-02 MED FILL — LISINOPRIL 2.5 MG TABLET: 2.5 | 90 days supply | Qty: 90 | Fill #0

## 2017-11-02 MED FILL — valACYclovir HCL 1 GM TABS: 1 | 15 days supply | Qty: 15 | Fill #0

## 2017-11-24 MED FILL — FLUCONAZOLE 150 MG TABS: 150 | 13 days supply | Qty: 6 | Fill #0

## 2018-01-09 ENCOUNTER — Other Ambulatory Visit: Payer: Self-pay | Admitting: Physician Assistant

## 2018-01-09 MED FILL — MELOXICAM 15 MG TABLET: 15 | 30 days supply | Qty: 30 | Fill #0

## 2018-01-09 MED FILL — METFORMIN HCL ER 500 MG TAB: 500 | 90 days supply | Qty: 360 | Fill #1

## 2018-01-09 MED FILL — glipiZIDE 5 MG TABS: 5 | 90 days supply | Qty: 180 | Fill #1

## 2018-01-09 MED FILL — PANTOPRAZOLE SOD DR 40 MG T: 40 | 30 days supply | Qty: 60 | Fill #0

## 2018-01-09 NOTE — Telephone Encounter (Signed)
Rx sent to pharmacy   

## 2018-01-10 DIAGNOSIS — H9313 Tinnitus, bilateral: Secondary | ICD-10-CM | POA: Diagnosis not present

## 2018-01-10 DIAGNOSIS — H900 Conductive hearing loss, bilateral: Secondary | ICD-10-CM | POA: Diagnosis not present

## 2018-01-15 DIAGNOSIS — Z6841 Body Mass Index (BMI) 40.0 and over, adult: Secondary | ICD-10-CM | POA: Diagnosis not present

## 2018-01-15 DIAGNOSIS — M791 Myalgia, unspecified site: Secondary | ICD-10-CM | POA: Diagnosis not present

## 2018-01-15 DIAGNOSIS — Z8261 Family history of arthritis: Secondary | ICD-10-CM | POA: Diagnosis not present

## 2018-01-15 DIAGNOSIS — M2550 Pain in unspecified joint: Secondary | ICD-10-CM | POA: Diagnosis not present

## 2018-01-15 DIAGNOSIS — M0600 Rheumatoid arthritis without rheumatoid factor, unspecified site: Secondary | ICD-10-CM | POA: Diagnosis not present

## 2018-01-15 DIAGNOSIS — M7989 Other specified soft tissue disorders: Secondary | ICD-10-CM | POA: Diagnosis not present

## 2018-01-16 DIAGNOSIS — R55 Syncope and collapse: Secondary | ICD-10-CM | POA: Diagnosis not present

## 2018-01-19 DIAGNOSIS — Z760 Encounter for issue of repeat prescription: Secondary | ICD-10-CM | POA: Diagnosis not present

## 2018-01-30 DIAGNOSIS — Z6841 Body Mass Index (BMI) 40.0 and over, adult: Secondary | ICD-10-CM | POA: Diagnosis not present

## 2018-01-30 DIAGNOSIS — J019 Acute sinusitis, unspecified: Secondary | ICD-10-CM | POA: Diagnosis not present

## 2018-01-30 DIAGNOSIS — B9689 Other specified bacterial agents as the cause of diseases classified elsewhere: Secondary | ICD-10-CM | POA: Diagnosis not present

## 2018-02-10 DIAGNOSIS — T782XXA Anaphylactic shock, unspecified, initial encounter: Secondary | ICD-10-CM | POA: Diagnosis not present

## 2018-02-10 DIAGNOSIS — R0902 Hypoxemia: Secondary | ICD-10-CM | POA: Diagnosis not present

## 2018-02-10 DIAGNOSIS — T7840XA Allergy, unspecified, initial encounter: Secondary | ICD-10-CM | POA: Diagnosis not present

## 2018-02-10 DIAGNOSIS — R220 Localized swelling, mass and lump, head: Secondary | ICD-10-CM | POA: Diagnosis not present

## 2018-02-10 DIAGNOSIS — L299 Pruritus, unspecified: Secondary | ICD-10-CM | POA: Diagnosis not present

## 2018-02-10 DIAGNOSIS — R0689 Other abnormalities of breathing: Secondary | ICD-10-CM | POA: Diagnosis not present

## 2018-02-12 DIAGNOSIS — Z6841 Body Mass Index (BMI) 40.0 and over, adult: Secondary | ICD-10-CM | POA: Diagnosis not present

## 2018-02-12 DIAGNOSIS — I499 Cardiac arrhythmia, unspecified: Secondary | ICD-10-CM | POA: Diagnosis not present

## 2018-02-12 DIAGNOSIS — Z91018 Allergy to other foods: Secondary | ICD-10-CM | POA: Diagnosis not present

## 2018-02-21 ENCOUNTER — Telehealth: Payer: Self-pay | Admitting: Internal Medicine

## 2018-02-21 NOTE — Telephone Encounter (Signed)
New Message   Referral in the system, pt wants to see Dr. Sallyanne Kuster but pt is established with [facility]. Please advise

## 2018-02-21 NOTE — Telephone Encounter (Signed)
No objection. Just follow the typical procedure, please MCr

## 2018-02-22 ENCOUNTER — Telehealth: Payer: Self-pay

## 2018-02-22 NOTE — Telephone Encounter (Signed)
Returned call to patient no answer.Left message on personal voice mail ok to change care to Dr.Croitoru.Advised to call back to schedule appointment.

## 2018-02-22 NOTE — Telephone Encounter (Signed)
Spoke to patient appointment scheduled with Dr.Croitoru 03/13/18 at 3:20 pm.

## 2018-02-22 NOTE — Telephone Encounter (Signed)
Fine with me.  Dr. H 

## 2018-02-26 ENCOUNTER — Ambulatory Visit (INDEPENDENT_AMBULATORY_CARE_PROVIDER_SITE_OTHER): Payer: 59 | Admitting: Internal Medicine

## 2018-02-26 VITALS — BP 138/90 | HR 88 | Ht 63.0 in | Wt 225.0 lb

## 2018-02-26 DIAGNOSIS — E1165 Type 2 diabetes mellitus with hyperglycemia: Secondary | ICD-10-CM

## 2018-02-26 DIAGNOSIS — E781 Pure hyperglyceridemia: Secondary | ICD-10-CM | POA: Diagnosis not present

## 2018-02-26 DIAGNOSIS — M0600 Rheumatoid arthritis without rheumatoid factor, unspecified site: Secondary | ICD-10-CM | POA: Diagnosis not present

## 2018-02-26 LAB — POCT GLYCOSYLATED HEMOGLOBIN (HGB A1C): Hemoglobin A1C: 9.5 % — AB (ref 4.0–5.6)

## 2018-02-26 MED ORDER — METFORMIN HCL ER 500 MG PO TB24: ORAL_TABLET | ORAL | 3 refills | Status: DC

## 2018-02-26 NOTE — Patient Instructions (Addendum)
Please restart: - Metformin ER 500 mg with dinner.  Continue: - Glipizide 5 mg 2x a day before breakfast and dinner  Please return in 4 months with your sugar log.

## 2018-02-26 NOTE — Progress Notes (Signed)
+Patient ID: Morgan Drake, female   DOB: 09/22/1970, 47 y.o.   MRN: [id number]  HPI: HOLLEY KOCUREK is a 47 y.o.-year-old female, initially referred by Dr. [name] ([place], [place]), returning for f/u for DM2, dx as GDM in 1993 and 1998 and DM2 in 1999, non-insulin-dependent, uncontrolled, without long-term complications. Last visit 4 months ago  Earlier in the year, she had a lot of stress in her life since last visit (daughter split from partner, OD'ed, she is keeping her children, which are disabled, also husband shut her out of the house). She missed med doses and sugars were higher.  Right before I saw her in 07/2017, she was getting back on track: Bought a house where she will move with her grandchildren.  Sugar started to improve since then and she also started to adjust her diet by cutting out late eating and adjusting the intake of sweets.  She had an anaphylactic rxn to humus. She was on a Prednisone taper - 10 days and also Solumedrol.  She was dx'ed with RA >> on MTX and Meloxicam. Pain a little better.   Last hemoglobin A1c was: 10/16/2017: HbA1c calculated from fructosamine is much lower than the measured one: 7.2%! 07/17/2017: HbA1c calculated from fructosamine is much better: 7.6%! 05/29/2017: HbA1c 10.5% Lab Results  Component Value Date   HGBA1C 9.2 (A) 10/16/2017   HGBA1C 7.7 10/10/2016   HGBA1C 8.7 05/30/2016  09/07/2015: 7.1%  Pt was on a regimen of: - Glyxambi 25-5 mg at bedtime (!) - Trulicity 1.5 mg weekly  Currently on:  - Metformin ER 1500-2000 mg with dinner >> diarrhea >> stopped 3-3.5 weeks - Glipizide 5 mg before b'fast - added 05/2015 >> 5 mg 2x a day before b'fast and dinner She stopped Invokana 100 mg in am, before b'fast >> stopped b/c repeated yeast infections. We had to stop Trulicity b/c nausea/vomiting. HbA1c increased afterwards She was on reg. Metformin >> tolerated it well for years, then started to have IBS - like sxs  and had to stop. She was on Actos, Avandia. She refused insulin in the past.  Pt checks her sugars 1-2 times a day: - am: 150-180 >> 158-160s, 226 >> 105-130 - 2h after b'fast: n/c >> 300s after creamer - before lunch: 80-100 >> 85, 110-114 >> 99-140 - 2h after lunch: n/c - before dinner: 120-130s >> 115-120s >> 90s - 2h after dinner: <180 >> 150-160s >> <180 - bedtime: 110-108 >> n/c - nighttime: n/c Lowest sugar was 80 >> 85 >> 90s; she has hypoglycemia awareness in the 80s. Highest sugar was 180 >> 326 >> 200 (Prednisone).  Glucometer: One Touch Ultra  Pt's meals are: - Breakfast: 1/2 biscuit + lettuce, tomatoes, bacon or bacon or sausage - Lunch: salad + meat  - Dinner: salad + meat + beans or baked potatoes - Snacks: 1: cheese nips or fruit  -No CKD, last BUN/creatinine:  05/29/2017:CMP normal, except glucose 102.  BUN/creatinine: 10/0.8 Lab Results  Component Value Date   BUN 12 12/21/2015   BUN 10 09/27/2012   CREATININE 0.70 12/21/2015   CREATININE 0.64 09/27/2012  09/07/2015: BUN/creatinine 10/0.78, EGFR 93. At that time, glucose 226. AST and ALT normal. On lisinopril.  -+ Hypertriglyceridemia; last set of lipids: 05/29/2017: 244/424/47/n/c 09/07/2015: 203/120/74/105 04/2015: 216/169/59/116 No results found for: CHOL, HDL, LDLCALC, LDLDIRECT, TRIG, CHOLHDL  On simvastatin and flaxseed oil 2X a day.  - last eye exam was in 10/2015: No DR, + small cataract.  Did not see Dr. Prudencio Burly yet.  -No numbness and tingling in her feet.  She had this but improved after stopping bread. 2 daughters have gluten sensitivity and 1 daughter with Chrohn ds.  On ASA 81.  She also has a history of fatty liver, IBS with both constipation and diarrhea, anxiety/depression, GERD, PCOS (she had to have fertility treatment when she got pregnant both times).  She is seeing Dr. Gavin Pound with rheumatology for inflammatory arthritis.  ROS: Constitutional: + weight loss, no fatigue, no  subjective hyperthermia, no subjective hypothermia Eyes: no blurry vision, no xerophthalmia ENT: no sore throat, no nodules palpated in neck, no dysphagia, no odynophagia, no hoarseness Cardiovascular: no CP/no SOB/no palpitations/no leg swelling Respiratory: no cough/no SOB/no wheezing Gastrointestinal: no N/no V/no D/no C/no acid reflux Musculoskeletal: no muscle aches/+ joint aches Skin: no rashes, no hair loss Neurological: no tremors/no numbness/no tingling/no dizziness  I reviewed pt's medications, allergies, PMH, social hx, family hx, and changes were documented in the history of present illness. Otherwise, unchanged from my initial visit note.  Past Medical History:  Diagnosis Date  . Atopic dermatitis, mild   . Bell's palsy   . Diabetes mellitus without complication ([place])    type 2  . Diabetic peripheral neuropathy ([place])   . GI bleed   . Hiatal hernia   . Hyperlipidemia   . Hypertriglyceridemia    > 1000  . Metatarsal fracture   . Obesity   . PCOS (polycystic ovarian syndrome)   . Pericarditis 2008   Hospitalized at [facility] overnight, told she had an MI, no cath. Thinks they said pericarditis   Past Surgical History:  Procedure Laterality Date  . ABDOMINAL HYSTERECTOMY    . CESAREAN SECTION    . CHOLECYSTECTOMY     Social History   Social History  . Marital Status: Single    Spouse Name: N/A  . Number of Children: 2   Occupational History  . RN  [employer]   Social History Main Topics  . Smoking status: Former Smoker -- 1.00 packs/day for 22 years    Types: Cigarettes  . Smokeless tobacco: Never Used     Comment: quit 2016  . Alcohol Use: 0.0 oz/week    0 Standard drinks or equivalent per week     Comment: rare  . Drug Use: No   Current Outpatient Medications on File Prior to Visit  Medication Sig Dispense Refill  . aspirin 81 MG tablet Take 81 mg by mouth daily.    . Cetirizine HCl (ZYRTEC ALLERGY) 10 MG CAPS Take 10 mg by mouth daily as needed.     . Flaxseed, Linseed, (FLAX SEED OIL) 1000 MG CAPS Take by mouth.    . fluticasone (FLONASE) 50 MCG/ACT nasal spray Place 2 sprays into both nostrils daily.    Marland Kitchen glipiZIDE (GLUCOTROL) 5 MG tablet TAKE 1 TABLET BY MOUTH DAILY BEFORE BREAKFAST 90 tablet 3  . lisinopril (PRINIVIL,ZESTRIL) 2.5 MG tablet Take 2.5 mg by mouth daily.    . meloxicam (MOBIC) 15 MG tablet   0  . metFORMIN (GLUCOPHAGE-XR) 500 MG 24 hr tablet Take 4 tablets with dinner 360 tablet 3  . pantoprazole (PROTONIX) 40 MG tablet TAKE 1 TABLET BY MOUTH TWICE DAILY BEFORE A MEAL 60 tablet 6  . ranitidine (ZANTAC) 150 MG tablet Take 150 mg by mouth daily.  2  . simvastatin (ZOCOR) 10 MG tablet Take 10 mg by mouth at bedtime.    Suzan Nailer METRIX BLOOD  GLUCOSE TEST test strip USE AS INSTRUCTED CHECK ONE TIME DAILY. 100 each 3  . TRUEPLUS LANCETS 30G MISC   6   No current facility-administered medications on file prior to visit.    Allergies  Allergen Reactions  . Magnesium Citrate Nausea And Vomiting  . Cinnamon Other (See Comments)    Blisters in mouth  . Eggs Or Egg-Derived Products     sensitivity   . Flu Virus Vaccine   . Pineapple Other (See Comments)    Tongue feels like furry spikes    Family History  Problem Relation Age of Onset  . Lung cancer Father   . Hypertension Father   . Hyperlipidemia Father   . CAD Father   . Heart attack Father   . Diabetes Father   . Mesothelioma Father   . COPD Other   . Heart attack Brother   . Depression Mother   . Heart disease Mother   . Alcoholism Mother   . Hyperlipidemia Mother   . Congestive Heart Failure Mother   . CAD Mother   . Anemia Mother   . GER disease Mother   . Rheum arthritis Mother   . CAD Maternal Grandmother   . Congestive Heart Failure Maternal Grandmother   . Diabetes Maternal Grandmother   . Heart attack Maternal Grandmother   . Breast cancer Maternal Grandmother        65-67  . Diabetes Paternal Grandmother   . Esophageal cancer Maternal  Grandfather    PE: BP 138/90   Pulse 88   Ht '5\' 3"'  (1.6 m)   Wt 225 lb (102.1 kg)   SpO2 98%   BMI 39.86 kg/m  Wt Readings from Last 3 Encounters:  02/26/18 225 lb (102.1 kg)  10/16/17 229 lb (103.9 kg)  07/17/17 232 lb (105.2 kg)   Constitutional: overweight, in NAD Eyes: PERRLA, EOMI, no exophthalmos ENT: moist mucous membranes, no thyromegaly, no cervical lymphadenopathy Cardiovascular: RRR, No MRG Respiratory: CTA B Gastrointestinal: abdomen soft, NT, ND, BS+ Musculoskeletal: no deformities, strength intact in all 4 Skin: moist, warm, no rashes Neurological: no tremor with outstretched hands, DTR normal in all 4  ASSESSMENT: 1. DM2, non-insulin-dependent, uncontrolled, without long term complications, but with hyperglycemia - we stopped Trulicity b/c N/V, Invokana b/c Yeast vaginitis  2. HTG  3. Obesity class 3  PLAN:  1. Patient with long-standing, uncontrolled, type 2 diabetes, worsened earlier in the year due to increased stress at home.  She was missing many medication doses and was almost homeless.  Since then, she moved to a house and started to eat better so her sugar started to improve.  At last visit, we did not change her regimen but we discussed about stopping eating after 5 or 6 pM, not with creamer in her coffee and eat less processed foods.  At that time, HbA1c was improved to 9.2%, however, we also checked a fructosamine level since her sugars were non-congruent with her measured HbA1c.  This was much better, at 7.2%. - at this visit, sugars are better in am after moving the glipizide from lunch to dinner but she had to stop Metformin ER 2/2 AP. Will restart at a lower dose >> 500 mg with dinner. - I suggested to:  Patient Instructions  Please restart: - Metformin ER 500 mg with dinner.  Continue: - Glipizide 5 mg 2x a day before breakfast and dinner  Please return in 4 months with your sugar log.   - check fructosamine today -  continue checking  sugars at different times of the day - check 1x a day, rotating checks - Again advised her to schedule an annual eye exam as she is not UTD - UTD with flu shot - Return to clinic in 4 mo with sugar log    2. HTG - Reviewed latest lipid panel from 05/2017: Triglycerides high - Continues Zocor without side effects.  We also added flaxseed oil twice a day  3. Obesity class 3 -She continues to adjust her diet -She lost 4 more pounds since last visit   Office Visit on 02/26/2018  Component Date Value Ref Range Status  . Fructosamine 02/26/2018 341* 190 - 270 umol/L Final  . Hemoglobin A1C 02/26/2018 9.5* 4.0 - 5.6 % Final   HbA1c calculated from fructosamine is much better than the measured one, at 7.4%. Philemon Kingdom, MD PhD [facility] Endocrinology

## 2018-02-28 LAB — FRUCTOSAMINE: Fructosamine: 341 umol/L — ABNORMAL HIGH (ref 190–270)

## 2018-03-02 ENCOUNTER — Encounter: Payer: Self-pay | Admitting: Internal Medicine

## 2018-03-05 ENCOUNTER — Ambulatory Visit: Payer: 59 | Admitting: Allergy and Immunology

## 2018-03-05 ENCOUNTER — Encounter: Payer: Self-pay | Admitting: Allergy and Immunology

## 2018-03-05 VITALS — BP 122/86 | HR 72 | Temp 98.7°F | Resp 22 | Ht 62.3 in | Wt 228.6 lb

## 2018-03-05 DIAGNOSIS — T7800XD Anaphylactic reaction due to unspecified food, subsequent encounter: Secondary | ICD-10-CM | POA: Diagnosis not present

## 2018-03-05 DIAGNOSIS — J3089 Other allergic rhinitis: Secondary | ICD-10-CM

## 2018-03-05 NOTE — Progress Notes (Signed)
Dear Dr. Helene Drake,  Thank you for referring Morgan Drake to the [facility] on 03/05/2018.   Below is a summation of this patient's evaluation and recommendations.  Thank you for your referral. I will keep you informed about this patient's response to treatment.   If you have any questions please do not hesitate to contact me.   Sincerely,  Morgan Prows, MD Allergy / Immunology Morgan Drake   ______________________________________________________________________    NEW PATIENT NOTE  Referring Provider: Ronita Hipps, MD Primary Provider: Ronita Hipps, MD Date of office visit: 03/05/2018    Subjective:   Chief Complaint:  Morgan Drake (DOB: 10/14/1970) is a 47 y.o. female who presents to the clinic on 03/05/2018 with a chief complaint of Allergic Reaction .     HPI: Morgan Drake presents to this clinic in evaluation of a possible food reaction.  Apparently on 10 February 2018 after eating hummus at [facility] foods she developed diffuse urticaria and flushing and lip swelling and then found it hard to talk and developed stridor for which she was given epinephrine in the field.  Apparently she almost passed out as well and she was taken to the emergency room and given fluids and various other medications and within 6 hours she was entirely normal.  There was no obvious provoking factor giving rise to this issue other than the fact that she ate hummus.  She did not have consumption of any over-the-counter medication or prescription medications temporally related to this episode and all she had for breakfast this morning with cereal and milk.  The hummus contained chickpea without any tahini or sesame seed and it also contained nisin which is a preservative made by bacteria.  She does have a history of allergic rhinitis with sneezing and nose rubbing and postnasal drip for which she uses Zyrtec  and Claritin successfully.  She does have a problem eating partially cooked eggs as she develops "flulike" symptoms that last about 3 days.  She can eat baked egg products with no problem at all.  She has never had an associated systemic reaction with this consumption.  She does use lisinopril for her systemic arterial hypertension.  She has recently been diagnosed with seronegative rheumatoid arthritis for which she started methotrexate about 5 weeks ago which has helped her joint swelling but not her pain.  Past Medical History:  Diagnosis Date  . Acid reflux   . Atopic dermatitis, mild   . Bell's palsy   . Diabetes mellitus without complication ([place])    type 2  . Diabetic peripheral neuropathy ([place])   . GI bleed   . Hiatal hernia   . Hyperlipidemia   . Hypertriglyceridemia    > 1000  . Metatarsal fracture   . Obesity   . PCOS (polycystic ovarian syndrome)   . Pericarditis 2008   Hospitalized at [facility] overnight, told she had an MI, no cath. Thinks they said pericarditis  . Tonsil stone     Past Surgical History:  Procedure Laterality Date  . ABDOMINAL HYSTERECTOMY    . CESAREAN SECTION    . CHOLECYSTECTOMY      Allergies as of 03/05/2018      Reactions   Magnesium Citrate Nausea And Vomiting   Cinnamon Other (See Comments)   Blisters in mouth   Eggs Or Egg-derived Products    sensitivity    Flu Virus  Vaccine    Pineapple Other (See Comments)   Tongue feels like furry spikes       Medication List      EPINEPHrine 0.3 mg/0.3 mL Soaj injection Commonly known as:  EPI-PEN INJ THE CONTENT OF 1 PEN UTD   Flax Seed Oil 1000 MG Caps Take by mouth.   folic acid 1 MG tablet Commonly known as:  FOLVITE Take 3 mg by mouth daily.   furosemide 20 MG tablet Commonly known as:  LASIX Take 20 mg by mouth daily as needed.   glipiZIDE 5 MG tablet Commonly known as:  GLUCOTROL TAKE 1 TABLET BY MOUTH DAILY BEFORE BREAKFAST   lisinopril 2.5 MG tablet Commonly known  as:  PRINIVIL,ZESTRIL Take 2.5 mg by mouth daily.   meloxicam 15 MG tablet Commonly known as:  MOBIC Take 15 mg by mouth daily.   metFORMIN 500 MG 24 hr tablet Commonly known as:  GLUCOPHAGE-XR Take 1 tablet with dinner   methotrexate 1 g injection Commonly known as:  50 mg/ml Inject into the vein once a week.   multivitamin tablet Take 1 tablet by mouth daily.   pantoprazole 40 MG tablet Commonly known as:  PROTONIX TAKE 1 TABLET BY MOUTH TWICE DAILY BEFORE A MEAL   ranitidine 150 MG tablet Commonly known as:  ZANTAC Take 150 mg by mouth daily.   simvastatin 10 MG tablet Commonly known as:  ZOCOR Take 10 mg by mouth at bedtime.   TRUE METRIX BLOOD GLUCOSE TEST test strip Generic drug:  glucose blood USE AS INSTRUCTED CHECK ONE TIME DAILY.   TRUEPLUS LANCETS 30G Misc       Review of systems negative except as noted in HPI / PMHx or noted below:  Review of Systems  Constitutional: Negative.   HENT: Negative.   Eyes: Negative.   Respiratory: Negative.   Cardiovascular: Negative.   Gastrointestinal: Negative.   Genitourinary: Negative.   Musculoskeletal: Negative.   Skin: Negative.   Neurological: Negative.   Endo/Heme/Allergies: Negative.   Psychiatric/Behavioral: Negative.     Family History  Problem Relation Age of Onset  . Lung cancer Father   . Hypertension Father   . Hyperlipidemia Father   . CAD Father   . Heart attack Father   . Diabetes Father   . Mesothelioma Father        Asbestos exposure  . COPD Father   . COPD Other   . Heart attack Brother   . Graves' disease Brother   . Diabetes Brother   . Hyperlipidemia Brother   . Depression Mother   . Heart disease Mother   . Alcoholism Mother   . Hyperlipidemia Mother   . Congestive Heart Failure Mother   . CAD Mother   . Anemia Mother   . GER disease Mother   . Rheum arthritis Mother   . Heart attack Mother   . Ovarian cancer Mother   . COPD Mother   . Diabetes Mother   . Graves'  disease Mother   . Irritable bowel syndrome Mother   . Dementia Mother   . CAD Maternal Grandmother   . Congestive Heart Failure Maternal Grandmother   . Diabetes Maternal Grandmother   . Heart attack Maternal Grandmother   . Breast cancer Maternal Grandmother        65-67  . Hyperlipidemia Maternal Grandmother   . Diabetes Paternal Grandmother   . Thyroid cancer Paternal Grandmother   . Esophageal cancer Maternal Grandfather   . Barrett's esophagus  Maternal Grandfather   . Heart disease Maternal Grandfather   . Congestive Heart Failure Maternal Grandfather   . COPD Maternal Grandfather   . Hashimoto's thyroiditis Sister   . Diabetes Sister   . Rheum arthritis Sister   . Hyperlipidemia Sister   . Diabetes Paternal Grandfather   . Heart disease Paternal Grandfather   . Hyperlipidemia Paternal Grandfather   . Diabetes Sister   . Hyperlipidemia Sister   . Hypothyroidism Sister     Social History   Socioeconomic History  . Marital status: Single    Spouse name: Not on file  . Number of children: Not on file  . Years of education: Not on file  . Highest education level: Not on file  Occupational History  . Occupation: Optician, dispensing: [employer]  . Financial resource strain: Not on file  . Food insecurity:    Worry: Not on file    Inability: Not on file  . Transportation needs:    Medical: Not on file    Non-medical: Not on file  Tobacco Use  . Smoking status: Former Smoker    Packs/day: 1.00    Years: 22.00    Pack years: 22.00    Types: Cigarettes  . Smokeless tobacco: Never Used  . Tobacco comment: quit december 2015  Substance and Sexual Activity  . Alcohol use: Not Currently    Alcohol/week: 0.0 standard drinks  . Drug use: Never  . Sexual activity: Not on file  Lifestyle  . Physical activity:    Days per week: Not on file    Minutes per session: Not on file  . Stress: Not on file  Relationships  . Social connections:    Talks on  phone: Not on file    Gets together: Not on file    Attends religious service: Not on file    Active member of club or organization: Not on file    Attends meetings of clubs or organizations: Not on file    Relationship status: Not on file  . Intimate partner violence:    Fear of current or ex partner: Not on file    Emotionally abused: Not on file    Physically abused: Not on file    Forced sexual activity: Not on file  Other Topics Concern  . Not on file  Social History Narrative  . Not on file    Environmental and Social history  Lives in a house with a dry environment, no animals located inside the household, carpet in the bedroom, plastic on the bed, plastic on the pillow, and no smokers located inside the household.  She is an Therapist, sports.  Objective:   Vitals:   03/05/18 1033  BP: 122/86  Pulse: 72  Resp: (!) 22  Temp: 98.7 F (37.1 C)   Height: 5' 2.3" (158.2 cm) Weight: 228 lb 9.6 oz (103.7 kg)  Physical Exam  HENT:  Head: Normocephalic. Head is without right periorbital erythema and without left periorbital erythema.  Right Ear: Tympanic membrane, external ear and ear canal normal.  Left Ear: Tympanic membrane, external ear and ear canal normal.  Nose: Nose normal. No mucosal edema or rhinorrhea.  Mouth/Throat: Oropharynx is clear and moist and mucous membranes are normal. No oropharyngeal exudate.  Eyes: Pupils are equal, round, and reactive to light. Conjunctivae and lids are normal.  Neck: Trachea normal. No tracheal deviation present. No thyromegaly present.  Cardiovascular: Normal rate, regular rhythm, S1 normal, S2 normal  and normal heart sounds.  No murmur heard. Pulmonary/Chest: Effort normal. No stridor. No respiratory distress. She has no wheezes. She has no rales. She exhibits no tenderness.  Abdominal: Soft. She exhibits no distension and no mass. There is no hepatosplenomegaly. There is no tenderness. There is no rebound and no guarding.  Musculoskeletal:  She exhibits no edema or tenderness.  Lymphadenopathy:       Head (right side): No tonsillar adenopathy present.       Head (left side): No tonsillar adenopathy present.    She has no cervical adenopathy.    She has no axillary adenopathy.  Neurological: She is alert.  Skin: No rash noted. She is not diaphoretic. No erythema. No pallor. Nails show no clubbing.    Diagnostics: Allergy skin tests were performed.  She did not demonstrate any hypersensitivity against the screening panel of aeroallergens or foods.  Assessment and Plan:    1. Anaphylactic shock due to food, subsequent encounter   2. Perennial allergic rhinitis     1.  Allergen avoidance measures  2.  Can treat and prevent inflammation with OTC Nasacort 1 spray each nostril 3-7 times per week  3.  Can use cetirizine/loratadine daily  4.  Can use EpiPen, Benadryl, MD/ER evaluation for allergic reaction  5.  Lisinopril?  6.  Blood - chickpea IgE, nisin IgE  7.  Review all blood tests from past 6 months  8.  Further evaluation treatment?  Yes if recurrent reactions  Safaa had some form of immunological reaction but the trigger for this issue is really unknown at this point in time.  We will work through this issue in more detail by checking antigen specific IgE antibodies as noted above.  If she has recurrent reactions then she needs to come off her lisinopril as the use of this antihypertensive medicine would be a relative contraindication in the setting of recurrent allergic reactions.  She does appear to have some other issues that I addressed today including her rhinitis.  I will contact her with the results of her blood test once they are available for review.  She will contact me if she has a recurrent reaction in the future.  Morgan Prows, MD Allergy / Immunology [facility]

## 2018-03-05 NOTE — Patient Instructions (Addendum)
  1.  Allergen avoidance measures  2.  Can treat and prevent inflammation with OTC Nasacort 1 spray each nostril 3-7 times per week  3.  Can use cetirizine/loratadine daily  4.  Can use EpiPen, Benadryl, MD/ER evaluation for allergic reaction  5.  Lisinopril?  6.  Blood - chickpea IgE, nisin IgE  7.  Review all blood tests from past 6 months  8.  Further evaluation treatment?  Yes if recurrent reactions

## 2018-03-06 ENCOUNTER — Encounter: Payer: Self-pay | Admitting: Allergy and Immunology

## 2018-03-06 MED ORDER — TRIAMCINOLONE ACETONIDE 55 MCG/ACT NA AERO: INHALATION_SPRAY | NASAL | 0 refills | Status: AC

## 2018-03-13 ENCOUNTER — Ambulatory Visit: Payer: 59 | Admitting: Cardiovascular Disease

## 2018-03-13 ENCOUNTER — Encounter: Payer: Self-pay | Admitting: *Deleted

## 2018-03-15 MED FILL — LISINOPRIL 2.5 MG TABLET: 2.5 | 90 days supply | Qty: 90 | Fill #1

## 2018-03-15 MED FILL — PANTOPRAZOLE SOD DR 40 MG T: 40 | 30 days supply | Qty: 60 | Fill #1

## 2018-03-15 MED FILL — SIMVASTATIN 10 MG TABS: 10 | 90 days supply | Qty: 90 | Fill #0

## 2018-03-15 MED FILL — raNITIdine HCL 150 MG TABS: 150 | 20 days supply | Qty: 20 | Fill #1

## 2018-03-20 DIAGNOSIS — T7800XD Anaphylactic reaction due to unspecified food, subsequent encounter: Secondary | ICD-10-CM | POA: Diagnosis not present

## 2018-03-23 LAB — ALLERGEN, CHICK PEA, RF309, IGE

## 2018-04-02 DIAGNOSIS — E7800 Pure hypercholesterolemia, unspecified: Secondary | ICD-10-CM | POA: Diagnosis not present

## 2018-04-02 DIAGNOSIS — T782XXA Anaphylactic shock, unspecified, initial encounter: Secondary | ICD-10-CM | POA: Diagnosis not present

## 2018-04-02 DIAGNOSIS — M2550 Pain in unspecified joint: Secondary | ICD-10-CM | POA: Diagnosis not present

## 2018-04-02 DIAGNOSIS — E119 Type 2 diabetes mellitus without complications: Secondary | ICD-10-CM | POA: Diagnosis not present

## 2018-04-02 DIAGNOSIS — M791 Myalgia, unspecified site: Secondary | ICD-10-CM | POA: Diagnosis not present

## 2018-04-02 DIAGNOSIS — Z87891 Personal history of nicotine dependence: Secondary | ICD-10-CM | POA: Diagnosis not present

## 2018-04-02 DIAGNOSIS — Z8261 Family history of arthritis: Secondary | ICD-10-CM | POA: Diagnosis not present

## 2018-04-02 DIAGNOSIS — Z7952 Long term (current) use of systemic steroids: Secondary | ICD-10-CM | POA: Diagnosis not present

## 2018-04-02 DIAGNOSIS — Z79899 Other long term (current) drug therapy: Secondary | ICD-10-CM | POA: Diagnosis not present

## 2018-04-02 DIAGNOSIS — Z6841 Body Mass Index (BMI) 40.0 and over, adult: Secondary | ICD-10-CM | POA: Diagnosis not present

## 2018-04-02 DIAGNOSIS — M7989 Other specified soft tissue disorders: Secondary | ICD-10-CM | POA: Diagnosis not present

## 2018-04-02 DIAGNOSIS — Z7982 Long term (current) use of aspirin: Secondary | ICD-10-CM | POA: Diagnosis not present

## 2018-04-02 DIAGNOSIS — Z7984 Long term (current) use of oral hypoglycemic drugs: Secondary | ICD-10-CM | POA: Diagnosis not present

## 2018-04-02 DIAGNOSIS — M0600 Rheumatoid arthritis without rheumatoid factor, unspecified site: Secondary | ICD-10-CM | POA: Diagnosis not present

## 2018-04-02 DIAGNOSIS — I252 Old myocardial infarction: Secondary | ICD-10-CM | POA: Diagnosis not present

## 2018-04-10 ENCOUNTER — Telehealth: Payer: Self-pay | Admitting: Pharmacist

## 2018-04-10 NOTE — Telephone Encounter (Signed)
Called patient to schedule an appointment for the [facility]. Patient requested that I call her back later as she is getting ready to visit her mother at the hospital. Will call back tomorrow.

## 2018-04-12 ENCOUNTER — Ambulatory Visit: Payer: 59 | Admitting: Cardiovascular Disease

## 2018-04-12 ENCOUNTER — Encounter: Payer: Self-pay | Admitting: Cardiovascular Disease

## 2018-04-12 VITALS — BP 122/86 | HR 73 | Ht 63.0 in | Wt 226.6 lb

## 2018-04-12 DIAGNOSIS — E669 Obesity, unspecified: Secondary | ICD-10-CM | POA: Diagnosis not present

## 2018-04-12 DIAGNOSIS — R002 Palpitations: Secondary | ICD-10-CM | POA: Diagnosis not present

## 2018-04-12 DIAGNOSIS — E782 Mixed hyperlipidemia: Secondary | ICD-10-CM | POA: Diagnosis not present

## 2018-04-12 DIAGNOSIS — R0602 Shortness of breath: Secondary | ICD-10-CM | POA: Diagnosis not present

## 2018-04-12 DIAGNOSIS — E1169 Type 2 diabetes mellitus with other specified complication: Secondary | ICD-10-CM | POA: Diagnosis not present

## 2018-04-12 MED ORDER — ICOSAPENT ETHYL 1 G PO CAPS: 2.0000 | ORAL_CAPSULE | Freq: Two times a day (BID) | ORAL | 5 refills | Status: DC

## 2018-04-12 NOTE — Patient Instructions (Addendum)
Medication Instructions:  Dr Sallyanne Kuster has recommended making the following medication changes: 1. START Vascepa 1 g - take 2 capsules twice daily  If you need a refill on your cardiac medications before your next appointment, please call your pharmacy.   Lab work: Your physician recommends that you return for lab work in 3 months. You will need to fast - NOTHING TO EAT AFTER MIDNIGHT.  If you have labs (blood work) drawn today and your tests are completely normal, you will receive your results only by: Marland Kitchen MyChart Message (if you have MyChart) OR . A paper copy in the mail If you have any lab test that is abnormal or we need to change your treatment, we will call you to review the results.  Testing/Procedures: Your physician has requested that you have an echocardiogram. Echocardiography is a painless test that uses sound waves to create images of your heart. It provides your doctor with information about the size and shape of your heart and how well your heart's chambers and valves are working. This procedure takes approximately one hour. There are no restrictions for this procedure.  >> This will be performed at our [facility] location [place], [place] [address] [phone]  Follow-Up: At [facility], you and your health needs are our priority.  As part of our continuing mission to provide you with exceptional heart care, we have created designated Provider Care Teams.  These Care Teams include your primary Cardiologist (physician) and Advanced Practice Providers (APPs -  Physician Assistants and Nurse Practitioners) who all work together to provide you with the care you need, when you need it. You will need a follow up appointment in 12 months.  Please call our office 2 months in advance to schedule this appointment.  You may see Sanda Klein, MD or one of the following Advanced Practice Providers on your designated Care Team: [place], [place] . Fabian Sharp,  PA-C . You will receive a reminder letter in the mail two months in advance. If you don't receive a letter, please call our office to schedule the follow-up appointment.

## 2018-04-13 MED FILL — VASCEPA 1 GM CAPSULE: 1 | 30 days supply | Qty: 120 | Fill #0

## 2018-04-14 NOTE — Progress Notes (Signed)
Cardiology Office Note:    Date:  04/14/2018   ID:  Morgan Drake, DOB 1970/11/25, MRN [id number]  PCP:  Ronita Hipps, MD  Cardiologist:  Sanda Klein, MD  Electrophysiologist:  None   Referring MD: Ronita Hipps, MD   Chief Complaint  Patient presents with  . Palpitations  . Shortness of Breath  . Chest Pain    History of Present Illness:    Morgan Drake is a 47 y.o. female with a strong family hx of premature onset cardiac illness, here for follow-up.  She does not have coronary artery disease based on a CT angiogram in 2014 but she does have numerous risk factors.  She has type 2 diabetes for which she takes a combination of metformin and glipizide.  Morgan Drake has severe hypertriglyceridemia and mild hyper cholesterolemia, but has been intolerant to numerous statins including Crestor and Lipitor.  Fish oil supplements caused worsening acid reflux so she is taking flaxseed oil.  She is tolerant of simvastatin at a low dose of 10 mg but did not tolerated and higher doses.  Her current weight places her in the morbidly obese category.  She has rheumatoid arthritis for which she takes meloxicam on a daily basis.  She has had a couple of episodes of severe allergic reactions with facial swelling and stridor due to her laryngeal edema.  She thinks it's due to a preservative used in [place] products.  Initially happened when she had hummus.  She required steroids and H2 blockers as well as to shots of epinephrine which caused a paradoxical reduction in heart rate from about 140 to 58.  Severe tachycardia.  Note that she does take an ACE inhibitor, but she took this for "years" before the episode of anaphylaxis/angioedema.  She has palpitations fairly frequently but these are usually isolated, except for the drop in heartbeat that happened when she received epinephrine.  She has occasional episodes of precordial chest pain that sounds mostly musculoskeletal.  She does not have  exertional angina.  She has some exertional dyspnea which she attributes to her weight.  She is primarily worried by her family history.  She denies leg edema, claudication, focal neurological events.  She has a variety of joint pain.  She has PCOS.  She had a normal coronary CT angiogram and a calcium score of 0 in 2014. She had a normal nuclear stress test in October 2016.  She had an essentially normal Holter monitor in September 2017. She has not had an echocardiogram yet.    Her mother was told that she had "pediatric-size blood vessels" and developed congestive heart failure due to cardiomyopathy.  She had a myocardial infarction at age 13 and eventually had bypass surgery.  Her father had a myocardial infarction at age 46.  She has a younger brother who had bypass surgery at age 59.  Past Medical History:  Diagnosis Date  . Acid reflux   . Atopic dermatitis, mild   . Bell's palsy   . Diabetes mellitus without complication ([place])    type 2  . Diabetic peripheral neuropathy ([place])   . GI bleed   . Hiatal hernia   . Hyperlipidemia   . Hypertriglyceridemia    > 1000  . Metatarsal fracture   . Obesity   . PCOS (polycystic ovarian syndrome)   . Pericarditis 2008   Hospitalized at [facility] overnight, told she had an MI, no cath. Thinks they said pericarditis  . Tonsil stone  Past Surgical History:  Procedure Laterality Date  . ABDOMINAL HYSTERECTOMY    . CESAREAN SECTION    . CHOLECYSTECTOMY      Current Medications: No outpatient medications have been marked as taking for the 04/12/18 encounter (Office Visit) with Keria Widrig, Dani Gobble, MD.     Allergies:   Magnesium citrate; Cinnamon; Eggs or egg-derived products; Flu virus vaccine; and Pineapple   Social History   Socioeconomic History  . Marital status: Single    Spouse name: Not on file  . Number of children: Not on file  . Years of education: Not on file  . Highest education level: Not on file  Occupational History  .  Occupation: Optician, dispensing: [employer]  . Financial resource strain: Not on file  . Food insecurity:    Worry: Not on file    Inability: Not on file  . Transportation needs:    Medical: Not on file    Non-medical: Not on file  Tobacco Use  . Smoking status: Former Smoker    Packs/day: 1.00    Years: 22.00    Pack years: 22.00    Types: Cigarettes  . Smokeless tobacco: Never Used  . Tobacco comment: quit december 2015  Substance and Sexual Activity  . Alcohol use: Not Currently    Alcohol/week: 0.0 standard drinks  . Drug use: Never  . Sexual activity: Not on file  Lifestyle  . Physical activity:    Days per week: Not on file    Minutes per session: Not on file  . Stress: Not on file  Relationships  . Social connections:    Talks on phone: Not on file    Gets together: Not on file    Attends religious service: Not on file    Active member of club or organization: Not on file    Attends meetings of clubs or organizations: Not on file    Relationship status: Not on file  Other Topics Concern  . Not on file  Social History Narrative  . Not on file     Family History: The patient's family history includes Alcoholism in her mother; Anemia in her mother; Barrett's esophagus in her maternal grandfather; Breast cancer in her maternal grandmother; CAD in her father, maternal grandmother, and mother; COPD in her father, maternal grandfather, mother, and other; Congestive Heart Failure in her maternal grandfather, maternal grandmother, and mother; Dementia in her mother; Depression in her mother; Diabetes in her brother, father, maternal grandmother, mother, paternal grandfather, paternal grandmother, sister, and sister; Esophageal cancer in her maternal grandfather; GER disease in her mother; Berenice Primas' disease in her brother and mother; Hashimoto's thyroiditis in her sister; Heart attack in her brother, father, maternal grandmother, and mother; Heart disease in her  maternal grandfather, mother, and paternal grandfather; Hyperlipidemia in her brother, father, maternal grandmother, mother, paternal grandfather, sister, and sister; Hypertension in her father; Hypothyroidism in her sister; Irritable bowel syndrome in her mother; Lung cancer in her father; Mesothelioma in her father; Ovarian cancer in her mother; Rheum arthritis in her mother and sister; Thyroid cancer in her paternal grandmother.  ROS:   Please see the history of present illness.     All other systems reviewed and are negative.  EKGs/Labs/Other Studies Reviewed:    The following studies were reviewed today: Coronary CTA and nuclear stress test  EKG:  EKG is ordered today.  The ekg ordered today demonstrates NSR, normal ECG.  Recent Labs: No results found  for requested labs within last 8760 hours.  Labs from November 02, 2017 Hemoglobin 12.8, platelets 325, glucose 2 3019, creatinine 0.8, normal liver function tests, potassium 4.0, hemoglobin A1c 8.8% (she reports that this increased to 10.9% after taking steroids)   Recent Lipid Panel No results found for: CHOL, TRIG, HDL, CHOLHDL, VLDL, LDLCALC, LDLDIRECT  Labs from October 27, 2017 Total cholesterol 244, triglycerides 424, HDL 47, unable to calculate LDL, non-HDL 197  Physical Exam:    VS:  BP 122/86   Pulse 73   Ht 5\' 3"  (1.6 m)   Wt 226 lb 9.6 oz (102.8 kg)   BMI 40.14 kg/m      Wt Readings from Last 3 Encounters:  04/12/18 226 lb 9.6 oz (102.8 kg)  03/05/18 228 lb 9.6 oz (103.7 kg)  02/26/18 225 lb (102.1 kg)     GEN: Morbidly obese well nourished, well developed in no acute distress HEENT: Normal NECK: No JVD; No carotid bruits LYMPHATICS: No lymphadenopathy CARDIAC: RRR, no murmurs, rubs, gallops RESPIRATORY:  Clear to auscultation without rales, wheezing or rhonchi  ABDOMEN: Soft, non-tender, non-distended MUSCULOSKELETAL:  No edema; No deformity  SKIN: Warm and dry NEUROLOGIC:  Alert and oriented x 3 PSYCHIATRIC:   Normal affect   ASSESSMENT:    1. Mixed hyperlipidemia   2. Morbid obesity ([place])   3. Diabetes mellitus type 2 in obese ([place])   4. Shortness of breath   5. Palpitations    PLAN:    In order of problems listed above:  1. Mixed hyperlipidemia: Hypertriglyceridemia is the most striking abnormality.  Glycemic control is critical, but she reports that her triglycerides are elevated even when she had better glucose control, over 500.  We will start Vascepa, which I hope she will tolerate better than over-the-counter fish oil supplements.  If this does not work, consider fenofibrate. 2. Morbid obesity: She has numerous features of the metabolic syndrome, full-blown diabetes mellitus and PCOS.  Weight loss would be the single most useful intervention for all of these problems.  Restriction and carbohydrate intake is critical. 3. DM: She reports that control has deteriorated further after receiving steroids for episodes of anaphylaxis.  Target A1c less than 7%. BP is well-controlled.  I think the ACE inhibitor was prescribed primarily for renal function protection. 4. Dyspnea: We will check an echocardiogram.  We know that she does not have coronary artery disease based on a normal CT angiogram with calcium score of 0 in 2014 and a normal nuclear stress test in 2016. 5. Palpitations: Previous arrhythmia monitor showed only isolated ectopy, no serious arrhythmia.   Medication Adjustments/Labs and Tests Ordered: Current medicines are reviewed at length with the patient today.  Concerns regarding medicines are outlined above.  Orders Placed This Encounter  Procedures  . Lipid panel  . EKG 12-Lead  . ECHOCARDIOGRAM COMPLETE   Meds ordered this encounter  Medications  . Icosapent Ethyl (VASCEPA) 1 g CAPS    Sig: Take 2 capsules (2 g total) by mouth 2 (two) times daily.    Dispense:  120 capsule    Refill:  5    Patient Instructions  Medication Instructions:  Dr Sallyanne Kuster has recommended  making the following medication changes: 1. START Vascepa 1 g - take 2 capsules twice daily  If you need a refill on your cardiac medications before your next appointment, please call your pharmacy.   Lab work: Your physician recommends that you return for lab work in 3 months. You will  need to fast - NOTHING TO EAT AFTER MIDNIGHT.  If you have labs (blood work) drawn today and your tests are completely normal, you will receive your results only by: Marland Kitchen MyChart Message (if you have MyChart) OR . A paper copy in the mail If you have any lab test that is abnormal or we need to change your treatment, we will call you to review the results.  Testing/Procedures: Your physician has requested that you have an echocardiogram. Echocardiography is a painless test that uses sound waves to create images of your heart. It provides your doctor with information about the size and shape of your heart and how well your heart's chambers and valves are working. This procedure takes approximately one hour. There are no restrictions for this procedure.  >> This will be performed at our [facility] location [place], [place] [address] [phone]  Follow-Up: At [facility], you and your health needs are our priority.  As part of our continuing mission to provide you with exceptional heart care, we have created designated Provider Care Teams.  These Care Teams include your primary Cardiologist (physician) and Advanced Practice Providers (APPs -  Physician Assistants and Nurse Practitioners) who all work together to provide you with the care you need, when you need it. You will need a follow up appointment in 12 months.  Please call our office 2 months in advance to schedule this appointment.  You may see Sanda Klein, MD or one of the following Advanced Practice Providers on your designated Care Team: [place], [place] . Fabian Sharp, PA-C . You will receive a reminder letter in the mail two  months in advance. If you don't receive a letter, please call our office to schedule the follow-up appointment.    Signed, Sanda Klein, MD  04/14/2018 2:48 PM    [place]

## 2018-04-17 ENCOUNTER — Encounter: Payer: Self-pay | Admitting: Pharmacist

## 2018-04-17 ENCOUNTER — Ambulatory Visit (INDEPENDENT_AMBULATORY_CARE_PROVIDER_SITE_OTHER): Payer: 59 | Admitting: Pharmacist

## 2018-04-17 DIAGNOSIS — Z79899 Other long term (current) drug therapy: Secondary | ICD-10-CM

## 2018-04-17 MED ORDER — ADALIMUMAB 40 MG/0.4ML ~~LOC~~ AJKT: 1.0000 "pen " | AUTO-INJECTOR | SUBCUTANEOUS | 2 refills | Status: DC

## 2018-04-17 MED FILL — HUMIRA PEN 40 MG/0.4ML PNKT: 40 | 28 days supply | Qty: 2 | Fill #0

## 2018-04-17 NOTE — Progress Notes (Signed)
   S: Patient presents to [facility] [place] for review of their specialty medication therapy.  Patient is currently taking Humira for rheumatoid arthritis. Patient is managed by Marella Chimes for this.   Of note, her daughter is on Humira for Crohn's so she is very aware of information about the drug.   Adherence: has not started yet  Efficacy: has not started yet. Was previously on methotrexate which caused hair loss.   Dosing: Rheumatoid arthritis: SubQ: 40 mg every other week (may continue methotrexate, other nonbiologic DMARDS, corticosteroids, NSAIDs, and/or analgesics); patients not taking concomitant methotrexate may increase dose to 40 mg every week   Dose adjustments: Renal: no dose adjustments (has not been studied) Hepatic: no dose adjustments (has not been studied)  Screening: TB test: completed per patient Hepatitis: completed per patient  Monitoring: S/sx of infection: denies CBC: followed by [facility] office S/sx of hypersensitivity: has not started S/sx of malignancy: has not started. Her ex husband's current wife is on Humira and was just diagnosed with gallbladder cancer so she is nervous but still wants to take the medication S/sx of heart failure: denies  O:     No results found for: WBC, HGB, HCT, MCV, PLT    Chemistry      Component Value Date/Time   NA 139 12/21/2015 1003   K 4.5 12/21/2015 1003   CL 105 12/21/2015 1003   CO2 24 12/21/2015 1003   BUN 12 12/21/2015 1003   CREATININE 0.70 12/21/2015 1003      Component Value Date/Time   CALCIUM 8.9 12/21/2015 1003       A/P: 1. Medication review: Patient currently on Humira for rheumatoid arthritis. Reviewed the medication with the patient, including the following: Humira is a TNF blocking agent indicated for ankylosing spondylitis, Crohn's disease, Hidradenitis suppurativa, psoriatic arthritis, plaque psoriasis, ulcerative colitis, and uveitis. Patient educated on purpose, proper use and  potential adverse effects of Humira. The most common adverse effects are infections, headache, and injection site reactions. There is the possibility of an increased risk of malignancy but it is not well understood if this increased risk is due to there medication or the disease state. There are rare cases of pancytopenia and aplastic anemia. No recommendations for any changes at this time.   Christella Hartigan, PharmD, BCPS, BCACP, CPP Clinical Pharmacist Practitioner  [phone]

## 2018-04-18 ENCOUNTER — Other Ambulatory Visit: Payer: Self-pay | Admitting: Internal Medicine

## 2018-04-18 MED FILL — ACCU-CHEK FASTCLIX LANCETS: 90 days supply | Qty: 102 | Fill #0

## 2018-04-18 MED FILL — glipiZIDE 5 MG TABS: 5 | 90 days supply | Qty: 180 | Fill #2

## 2018-04-18 MED FILL — ACCU-CHEK GUIDE TEST STRIP: 90 days supply | Qty: 100 | Fill #0

## 2018-04-30 ENCOUNTER — Ambulatory Visit (HOSPITAL_COMMUNITY): Payer: 59 | Attending: Cardiovascular Disease

## 2018-04-30 ENCOUNTER — Other Ambulatory Visit: Payer: Self-pay

## 2018-04-30 DIAGNOSIS — R0602 Shortness of breath: Secondary | ICD-10-CM | POA: Diagnosis not present

## 2018-05-10 MED FILL — HUMIRA PEN 40 MG/0.4ML PNKT: 40 | 28 days supply | Qty: 2 | Fill #1

## 2018-05-10 MED FILL — metFORMIN HCL ER 500 MG TB2: 500 | 90 days supply | Qty: 360 | Fill #2

## 2018-05-10 MED FILL — FUROSEMIDE 20 MG TABS: 20 | 90 days supply | Qty: 90 | Fill #1

## 2018-05-10 MED FILL — PANTOPRAZOLE SOD DR 40 MG T: 40 | 30 days supply | Qty: 60 | Fill #2

## 2018-05-11 MED FILL — VASCEPA 1 GM CAPSULE: 1 | 30 days supply | Qty: 120 | Fill #1

## 2018-05-13 DIAGNOSIS — R220 Localized swelling, mass and lump, head: Secondary | ICD-10-CM | POA: Diagnosis not present

## 2018-05-13 DIAGNOSIS — L270 Generalized skin eruption due to drugs and medicaments taken internally: Secondary | ICD-10-CM | POA: Diagnosis not present

## 2018-05-13 DIAGNOSIS — T782XXA Anaphylactic shock, unspecified, initial encounter: Secondary | ICD-10-CM | POA: Diagnosis not present

## 2018-05-13 DIAGNOSIS — T39315A Adverse effect of propionic acid derivatives, initial encounter: Secondary | ICD-10-CM | POA: Diagnosis not present

## 2018-05-14 ENCOUNTER — Telehealth: Payer: Self-pay

## 2018-05-14 MED FILL — EPINEPHRINE 0.3 MG AUTO-INJ: 0.3 | 30 days supply | Qty: 2 | Fill #0

## 2018-05-14 MED FILL — predniSONE 10 MG (21) TBPK: 10 | 6 days supply | Qty: 21 | Fill #0

## 2018-05-14 NOTE — Telephone Encounter (Signed)
Left message for patient to call the office

## 2018-05-14 NOTE — Telephone Encounter (Signed)
Please inform patient that NSAID can cause allergic reactions.  Usually people who have an allergy to NSAID can take meloxicam or Celebrex as these were called Cox 2 inhibitors versus Cox 1 inhibitors like Aleve/Naprosyn.  Would remain away from all forms of Cox 1 inhibitors like Aleve/Naprosyn, Motrin/ibuprofen, aspirin.

## 2018-05-14 NOTE — Telephone Encounter (Signed)
Patient had anaphylactic reaction yesterday about 15 minutes after taking Aleve.  She had taken Tylenol about 3 hours prior to taking the Aleve.  She had eye, throat and lip swelling, difficulty breathing, rash, nausea.  She did use Epipen and went to [facility].  Patient does recall that before her previous anaphylactic reaction she also did take Aleve about 30 minutes prior to reaction.  She has also noticed that she has severe nausea when she takes Meloxicam and aspirin.  Patient wondering if she has an NSAID allergy. She is doing better today. Note: patient has started Humira about three weeks ago, in place of her methotrexate.

## 2018-05-21 NOTE — Telephone Encounter (Signed)
Patient informed. 

## 2018-06-14 MED FILL — HUMIRA PEN 40 MG/0.4ML PNKT: 40 | 28 days supply | Qty: 2 | Fill #2

## 2018-07-02 ENCOUNTER — Ambulatory Visit (INDEPENDENT_AMBULATORY_CARE_PROVIDER_SITE_OTHER): Payer: 59 | Admitting: Internal Medicine

## 2018-07-02 ENCOUNTER — Other Ambulatory Visit: Payer: Self-pay | Admitting: Pharmacist

## 2018-07-02 ENCOUNTER — Encounter: Payer: Self-pay | Admitting: Internal Medicine

## 2018-07-02 ENCOUNTER — Other Ambulatory Visit: Payer: Self-pay

## 2018-07-02 ENCOUNTER — Encounter: Payer: Self-pay | Admitting: Pharmacist

## 2018-07-02 ENCOUNTER — Ambulatory Visit (INDEPENDENT_AMBULATORY_CARE_PROVIDER_SITE_OTHER): Payer: 59 | Admitting: Pharmacist

## 2018-07-02 VITALS — BP 140/88 | HR 86 | Ht 63.0 in | Wt 228.0 lb

## 2018-07-02 DIAGNOSIS — G5601 Carpal tunnel syndrome, right upper limb: Secondary | ICD-10-CM | POA: Diagnosis not present

## 2018-07-02 DIAGNOSIS — M0600 Rheumatoid arthritis without rheumatoid factor, unspecified site: Secondary | ICD-10-CM | POA: Diagnosis not present

## 2018-07-02 DIAGNOSIS — Z79899 Other long term (current) drug therapy: Secondary | ICD-10-CM

## 2018-07-02 DIAGNOSIS — M7989 Other specified soft tissue disorders: Secondary | ICD-10-CM | POA: Diagnosis not present

## 2018-07-02 DIAGNOSIS — Z8349 Family history of other endocrine, nutritional and metabolic diseases: Secondary | ICD-10-CM | POA: Diagnosis not present

## 2018-07-02 DIAGNOSIS — E781 Pure hyperglyceridemia: Secondary | ICD-10-CM

## 2018-07-02 DIAGNOSIS — Z8261 Family history of arthritis: Secondary | ICD-10-CM | POA: Diagnosis not present

## 2018-07-02 DIAGNOSIS — M791 Myalgia, unspecified site: Secondary | ICD-10-CM | POA: Diagnosis not present

## 2018-07-02 DIAGNOSIS — Z6841 Body Mass Index (BMI) 40.0 and over, adult: Secondary | ICD-10-CM | POA: Diagnosis not present

## 2018-07-02 DIAGNOSIS — E1165 Type 2 diabetes mellitus with hyperglycemia: Secondary | ICD-10-CM

## 2018-07-02 DIAGNOSIS — M2550 Pain in unspecified joint: Secondary | ICD-10-CM | POA: Diagnosis not present

## 2018-07-02 LAB — POCT GLYCOSYLATED HEMOGLOBIN (HGB A1C): Hemoglobin A1C: 8.9 % — AB (ref 4.0–5.6)

## 2018-07-02 LAB — LIPID PANEL
CHOL/HDL RATIO: 6
CHOLESTEROL: 283 mg/dL — AB (ref 0–200)
HDL: 49.4 mg/dL (ref >39.00)
Triglycerides: 514 mg/dL — ABNORMAL HIGH (ref 0.0–149.0)

## 2018-07-02 LAB — MICROALBUMIN / CREATININE URINE RATIO
Creatinine,U: 91.2 mg/dL
MICROALB UR: 1 mg/dL (ref 0.0–1.9)
MICROALB/CREAT RATIO: 1 mg/g (ref 0.0–30.0)

## 2018-07-02 LAB — TSH: TSH: 0.94 u[IU]/mL (ref 0.35–4.50)

## 2018-07-02 LAB — LDL CHOLESTEROL, DIRECT: Direct LDL: 112 mg/dL

## 2018-07-02 MED ORDER — DAPAGLIFLOZIN PROPANEDIOL 5 MG PO TABS: 5.0000 mg | ORAL_TABLET | Freq: Every day | ORAL | 11 refills | Status: DC

## 2018-07-02 MED ORDER — CERTOLIZUMAB PEGOL 2 X 200 MG ~~LOC~~ KIT: PACK | SUBCUTANEOUS | 0 refills | Status: DC

## 2018-07-02 MED FILL — traMADol HCL 50 MG TABS: 50 | 30 days supply | Qty: 30 | Fill #0

## 2018-07-02 MED FILL — FARXIGA 5 MG TABLET: 5 | 30 days supply | Qty: 30 | Fill #0

## 2018-07-02 NOTE — Progress Notes (Signed)
+Patient ID: Morgan Drake, female   DOB: [birth year], 48 y.o.   MRN: [id number]  HPI: Morgan Drake is a 48 y.o.-year-old female, initially referred by Dr. [name] ([place], [place]), returning for f/u for DM2, dx as GDM in 1993 and 1998 and DM2 in 1999, non-insulin-dependent, uncontrolled, without long-term complications. Last visit 4 months ago.  Since last OV >> anaphylaxis rxn to Al[place]ve x2. She was on steroids for these.  Sugars remain high after the treatment.  Last hemoglobin A1c was:  02/26/2018: HbA1c calculated from fructosamine is much better than the measured one, at 7.4%. 10/16/2017: HbA1c calculated from fructosamine is much lower than the measured one: 7.2%! 07/17/2017: HbA1c calculated from fructosamine is much better: 7.6%! 05/29/2017: HbA1c 10.5% Lab Results  Component Value Date   HGBA1C 9.5 (A) 02/26/2018   HGBA1C 9.2 (A) 10/16/2017   HGBA1C 7.7 10/10/2016  09/07/2015: 7.1%  Pt was on a regimen of: - Glyxambi 25-5 mg at bedtime (!) - Trulicity 1.5 mg weekly  Currently on:  - Metformin ER 1500-2000 mg with dinner >> decreased to 500 alternating with 1000 mg with dinner 02/2018 >> occasional diarrhea - Glipizide 5 mg 2x a day before b'fast and dinner She stopped Invokana 100 mg in am, before b'fast >> stopped b/c repeated yeast infections. We had to stop Trulicity b/c nausea/vomiting. HbA1c increased afterwards She was on reg. Metformin >> tolerated it well for years, then started to have IBS - like sxs and had to stop. She was on Actos, Avandia. She refused insulin in the past.  Pt checks her sugars 1-2 times a day: - am: 158-160s, 226 >> 105-130 >> 155-190 since the anaphylactic episodes - 2h after b'fast: n/c >> 300s after creamer - before lunch:  85, 110-114 >> 99-140 >> n/c - 2h after lunch: n/c - before dinner: 115-120s >> 90s >> n/c - 1-2h after dinner:  150-160s >> <180 >> 200-210  - bedtime: 110-108 >> n/c - nighttime:  n/c Lowest sugar was 80 >> 85 >> 90s >> 90s; she has hypoglycemia awareness in the 80s Highest sugar was 326 >> 200 (Prednisone) >> 210.  Glucometer: One Touch Ultra  Pt's meals are: - Breakfast: 1/2 biscuit + lettuce, tomatoes, bacon or bacon or sausage - Lunch: salad + meat  - Dinner: salad + meat + beans or baked potatoes - Snacks: 1: cheese nips or fruit  -No CKD, last BUN/creatinine:  05/29/2017:CMP normal, except glucose 102.  BUN/creatinine: 10/0.8 Lab Results  Component Value Date   BUN 12 12/21/2015   BUN 10 09/27/2012   CREATININE 0.70 12/21/2015   CREATININE 0.64 09/27/2012  09/07/2015: BUN/creatinine 10/0.78, EGFR 93. At that time, glucose 226. AST and ALT normal. On lisinopril.  -+ Hypertriglyceridemia; last set of lipids: 05/29/2017: 244/424/47/n/c 09/07/2015: 203/120/74/105 04/2015: 216/169/59/116 No results found for: CHOL, HDL, LDLCALC, LDLDIRECT, TRIG, CHOLHDL  She takes simvastatin and flaxseed oil 2X a day. Did not tolerate Vascepa.  - last eye exam was in Fall 2019: No DR, + small cataract.   -She denies numbness and tingling in her feet.  She had this in the past but it improved after stopping breath. 2 daughters have gluten sensitivity and 1 daughter with Chrohn ds.  She also has a history of fatty liver, IBS with both constipation and diarrhea, anxiety/depression, GERD, PCOS (she had to have fertility treatment when she got pregnant both times).  She is seeing Dr. Gavin Pound with rheumatology for inflammatory arthritis. She was dx'ed  with RA >> on MTX. Pain a little better.   She is off meloxicam and ranitidine.  ROS: Constitutional: no weight gain/no weight loss, no fatigue, no subjective hyperthermia, no subjective hypothermia Eyes: no blurry vision, no xerophthalmia ENT: no sore throat, no nodules palpated in neck, no dysphagia, no odynophagia, no hoarseness Cardiovascular: no CP/no SOB/no palpitations/no leg swelling Respiratory: no cough/no  SOB/no wheezing Gastrointestinal: no N/no V/no D/no C/no acid reflux Musculoskeletal: no muscle aches/no joint aches Skin: no rashes, no hair loss Neurological: no tremors/no numbness/no tingling/no dizziness  I reviewed pt's medications, allergies, PMH, social hx, family hx, and changes were documented in the history of present illness. Otherwise, unchanged from my initial visit note.  Past Medical History:  Diagnosis Date  . Acid reflux   . Atopic dermatitis, mild   . Bell's palsy   . Diabetes mellitus without complication ([place])    type 2  . Diabetic peripheral neuropathy ([place])   . GI bleed   . Hiatal hernia   . Hyperlipidemia   . Hypertriglyceridemia    > 1000  . Metatarsal fracture   . Obesity   . PCOS (polycystic ovarian syndrome)   . Pericarditis 2008   Hospitalized at [facility] overnight, told she had an MI, no cath. Thinks they said pericarditis  . Tonsil stone    Past Surgical History:  Procedure Laterality Date  . ABDOMINAL HYSTERECTOMY    . CESAREAN SECTION    . CHOLECYSTECTOMY     Social History   Social History  . Marital Status: Single    Spouse Name: N/A  . Number of Children: 2   Occupational History  . RN  [employer]   Social History Main Topics  . Smoking status: Former Smoker -- 1.00 packs/day for 22 years    Types: Cigarettes  . Smokeless tobacco: Never Used     Comment: quit 2016  . Alcohol Use: 0.0 oz/week    0 Standard drinks or equivalent per week     Comment: rare  . Drug Use: No   Current Outpatient Medications on File Prior to Visit  Medication Sig Dispense Refill  . ACCU-CHEK GUIDE test strip USE AS INSTRUCTED TO CHECK ONE TIME DAILY. 100 each 3  . Adalimumab (HUMIRA PEN) 40 MG/0.4ML PNKT Inject 1 pen into the skin every 14 (fourteen) days. As directed every other week subcutaneous 30 days 2 each 2  . EPINEPHrine 0.3 mg/0.3 mL IJ SOAJ injection INJ THE CONTENT OF 1 PEN UTD  0  . Flaxseed, Linseed, (FLAX SEED OIL) 1000 MG CAPS Take  by mouth.    . folic acid (FOLVITE) 1 MG tablet Take 3 mg by mouth daily.    . furosemide (LASIX) 20 MG tablet Take 20 mg by mouth daily as needed.    Marland Kitchen glipiZIDE (GLUCOTROL) 5 MG tablet TAKE 1 TABLET BY MOUTH DAILY BEFORE BREAKFAST 90 tablet 3  . Icosapent Ethyl (VASCEPA) 1 g CAPS Take 2 capsules (2 g total) by mouth 2 (two) times daily. 120 capsule 5  . lisinopril (PRINIVIL,ZESTRIL) 2.5 MG tablet Take 2.5 mg by mouth daily.    . meloxicam (MOBIC) 15 MG tablet Take 15 mg by mouth daily.   0  . metFORMIN (GLUCOPHAGE-XR) 500 MG 24 hr tablet Take 1 tablet with dinner 90 tablet 3  . Multiple Vitamin (MULTIVITAMIN) tablet Take 1 tablet by mouth daily.    . pantoprazole (PROTONIX) 40 MG tablet TAKE 1 TABLET BY MOUTH TWICE DAILY BEFORE A MEAL  60 tablet 6  . ranitidine (ZANTAC) 150 MG tablet Take 150 mg by mouth daily.  2  . simvastatin (ZOCOR) 10 MG tablet Take 10 mg by mouth at bedtime.    . triamcinolone (NASACORT ALLERGY 24HR) 55 MCG/ACT AERO nasal inhaler Use one spray in each nostril three to seven times weekly as directed. 1 Inhaler 0  . TRUEPLUS LANCETS 30G MISC   6   No current facility-administered medications on file prior to visit.    Allergies  Allergen Reactions  . Magnesium Citrate Nausea And Vomiting  . Cinnamon Other (See Comments)    Blisters in mouth  . Eggs Or Egg-Derived Products     sensitivity   . Flu Virus Vaccine   . Pineapple Other (See Comments)    Tongue feels like furry spikes    Family History  Problem Relation Age of Onset  . Lung cancer Father   . Hypertension Father   . Hyperlipidemia Father   . CAD Father   . Heart attack Father   . Diabetes Father   . Mesothelioma Father        Asbestos exposure  . COPD Father   . COPD Other   . Heart attack Brother   . Graves' disease Brother   . Diabetes Brother   . Hyperlipidemia Brother   . Depression Mother   . Heart disease Mother   . Alcoholism Mother   . Hyperlipidemia Mother   . Congestive Heart  Failure Mother   . CAD Mother   . Anemia Mother   . GER disease Mother   . Rheum arthritis Mother   . Heart attack Mother   . Ovarian cancer Mother   . COPD Mother   . Diabetes Mother   . Graves' disease Mother   . Irritable bowel syndrome Mother   . Dementia Mother   . CAD Maternal Grandmother   . Congestive Heart Failure Maternal Grandmother   . Diabetes Maternal Grandmother   . Heart attack Maternal Grandmother   . Breast cancer Maternal Grandmother        65-67  . Hyperlipidemia Maternal Grandmother   . Diabetes Paternal Grandmother   . Thyroid cancer Paternal Grandmother   . Esophageal cancer Maternal Grandfather   . Barrett's esophagus Maternal Grandfather   . Heart disease Maternal Grandfather   . Congestive Heart Failure Maternal Grandfather   . COPD Maternal Grandfather   . Hashimoto's thyroiditis Sister   . Diabetes Sister   . Rheum arthritis Sister   . Hyperlipidemia Sister   . Diabetes Paternal Grandfather   . Heart disease Paternal Grandfather   . Hyperlipidemia Paternal Grandfather   . Diabetes Sister   . Hyperlipidemia Sister   . Hypothyroidism Sister    PE: BP 140/88   Pulse 86   Ht '5\' 3"'$  (1.6 m)   Wt 228 lb (103.4 kg)   SpO2 96%   BMI 40.39 kg/m  Wt Readings from Last 3 Encounters:  07/02/18 228 lb (103.4 kg)  04/12/18 226 lb 9.6 oz (102.8 kg)  03/05/18 228 lb 9.6 oz (103.7 kg)   Constitutional: overweight, in NAD Eyes: PERRLA, EOMI, no exophthalmos ENT: moist mucous membranes, no thyromegaly, no cervical lymphadenopathy Cardiovascular: RRR, No MRG Respiratory: CTA B Gastrointestinal: abdomen soft, NT, ND, BS+ Musculoskeletal: no deformities, strength intact in all 4 Skin: moist, warm, no rashes Neurological: no tremor with outstretched hands, DTR normal in all 4  ASSESSMENT: 1. DM2, non-insulin-dependent, uncontrolled, without long term complications, but with hyperglycemia -  we stopped Trulicity b/c N/V, Invokana b/c Yeast  vaginitis  2. HTG  3. Obesity class 3  4.  Family history of thyroid disease -Graves' disease in mother and brother -Hypothyroidism in sister  PLAN:  1. Patient with longstanding, uncontrolled, type 2 diabetes, worsened earlier last year due to increased stress at home.  She was missing many medication doses and was almost homeless.  Since then, her home situation improved and she started to do better from her diabetes point of view.  At last visit, sugars were better in the morning after moving the glipizide from lunch to dinner but she had to stop metformin ER due to abdominal pain.  However, at that time, she was taking a high dose so I advised her to restart a low dose, 500 mg with dinner.  She continues glipizide. -After her steroid courses for anaphylaxis, her sugars remain high.  We discussed to add an SGLT2 inhibitor, flaxseed, at a low dose.  If this is not covered, we will try Jardiance.  We will stay away from [facility] as she had yeast infections on this. - I suggested to:  Patient Instructions  Please continue: - Metformin ER [phone] mg with dinner. - Glipizide 5 mg 2x a day before breakfast and dinner  Please start: - Farxiga 5 mg before b'fast  Please return in 3 months with your sugar log.   - today, HbA1c is 9.8%, however, for her, fructosamine levels are more accurate.  We will check a fructosamine level today - continue checking sugars at different times of the day - check 1x a day, rotating checks - advised for yearly eye exams >> she is UTD -We will check annual labs today - Return to clinic in 4 mo with sugar log    2. HTG - Reviewed latest lipid panel from 05/2017: Triglycerides high - Continues Zocor and flaxseed oil without side effects. -We will recheck lipid levels today  3. Obesity class 3 -Continues to work on adjusting her diet -Weight is stable since last visit -We will add an SGLT 2 inhibitor, which should also help with weight loss  4.  Family  history of thyroid disease -We will check a TSH per her request  Component     Latest Ref Rng & Units 07/02/2018  Glucose     65 - 99 mg/dL 257 (H)  BUN     7 - 25 mg/dL 10  Creatinine     0.50 - 1.10 mg/dL 0.69  GFR, Est Non African American     > OR = 60 mL/min/1.10m 104  GFR, Est African American     > OR = 60 mL/min/1.71m121  BUN/Creatinine Ratio     6 - 22 (calc) NOT APPLICABLE  Sodium     13016 146 mmol/L 135  Potassium     3.5 - 5.3 mmol/L 4.1  Chloride     98 - 110 mmol/L 103  CO2     20 - 32 mmol/L 24  Calcium     8.6 - 10.2 mg/dL 8.8  Total Protein     6.1 - 8.1 g/dL 6.4  Albumin MSPROF     3.6 - 5.1 g/dL 3.8  Globulin     1.9 - 3.7 g/dL (calc) 2.6  AG Ratio     1.0 - 2.5 (calc) 1.5  Total Bilirubin     0.2 - 1.2 mg/dL 0.5  Alkaline phosphatase (APISO)     31 - 125 U/L 91  AST     10 - 35 U/L 17  ALT     6 - 29 U/L 28  Cholesterol     0 - 200 mg/dL 283 (H)  Triglycerides     0.0 - 149.0 mg/dL 514.0 (H)  HDL Cholesterol     >39.00 mg/dL 49.40  Total CHOL/HDL Ratio      6  Microalb, Ur     0.0 - 1.9 mg/dL 1.0  Creatinine,U     mg/dL 91.2  MICROALB/CREAT RATIO     0.0 - 30.0 mg/g 1.0  Hemoglobin A1C     4.0 - 5.6 % 8.9 (A)  TSH     0.35 - 4.50 uIU/mL 0.94  Fructosamine     205 - 285 umol/L 366 (H)  Direct LDL     mg/dL 112.0   Thyroid function is normal. Kidney function is normal. Triglycerides very high, and LDL above goal.  These will likely improve when her diabetes becomes more controlled. Glucose also very high. HbA1c calculated from fructosamine is higher than before, at 7.8%, but lower than the measured HbA1c, of 8.9%.  Philemon Kingdom, MD PhD [facility] Endocrinology

## 2018-07-02 NOTE — Progress Notes (Signed)
   S: Patient presents to [facility] for review of their specialty medication therapy.  Patient was recently prescribed Cimzia for rheumatoid arthritis. Patient is managed by Marella Chimes for this.   She was previously on Humira but it did not work for her  Adherence: has not started yet  Efficacy: has not started yet. Failed methotrexate and Humira  Dosing: 400 mg at week 0, 2, 4 then 200 mg every other week  Dose adjustments: Renal: no dose adjustments (has not been studied) Hepatic: no dose adjustments (has not been studied)  Screening: TB test: completed per patient Hepatitis: completed per patient  Monitoring: S/sx of infection: denies CBC: followed by [facility] S/sx of hypersensitivity: has not started S/sx of malignancy: denies S/sx of heart failure: denies  O:     No results found for: WBC, HGB, HCT, MCV, PLT    Chemistry      Component Value Date/Time   NA 139 12/21/2015 1003   K 4.5 12/21/2015 1003   CL 105 12/21/2015 1003   CO2 24 12/21/2015 1003   BUN 12 12/21/2015 1003   CREATININE 0.70 12/21/2015 1003      Component Value Date/Time   CALCIUM 8.9 12/21/2015 1003       A/P: 1. Medication review: Patient currently prescribed Cimzia for rheumatoid arthritis but has not started it yet. Failed Humira. Cimzia is a TNF blocking agent. Possible adverse effects include increased risk of infection, increased risk of malignancy, injection site reactions, and GI upset. Patient denies any questions or concerns about Cimzia. No recommendations for any changes.   Christella Hartigan, PharmD, BCPS, BCACP, CPP Clinical Pharmacist Practitioner  [phone]

## 2018-07-02 NOTE — Patient Instructions (Addendum)
Please continue: - Metformin ER [phone] mg with dinner. - Glipizide 5 mg 2x a day before breakfast and dinner  Please start: - Farxiga 5 mg before b'fast  Please return in 3 months with your sugar log.

## 2018-07-03 ENCOUNTER — Other Ambulatory Visit: Payer: Self-pay | Admitting: Pharmacist

## 2018-07-03 MED ORDER — CERTOLIZUMAB PEGOL 6 X 200 MG/ML ~~LOC~~ KIT: PACK | SUBCUTANEOUS | 0 refills | Status: DC

## 2018-07-03 MED ORDER — CERTOLIZUMAB PEGOL 2 X 200 MG/ML ~~LOC~~ KIT: PACK | SUBCUTANEOUS | 3 refills | Status: DC

## 2018-07-04 LAB — COMPLETE METABOLIC PANEL WITH GFR
AG Ratio: 1.5 (calc) (ref 1.0–2.5)
ALBUMIN MSPROF: 3.8 g/dL (ref 3.6–5.1)
ALT: 28 U/L (ref 6–29)
AST: 17 U/L (ref 10–35)
Alkaline phosphatase (APISO): 91 U/L (ref 31–125)
BILIRUBIN TOTAL: 0.5 mg/dL (ref 0.2–1.2)
BUN: 10 mg/dL (ref 7–25)
CALCIUM: 8.8 mg/dL (ref 8.6–10.2)
CO2: 24 mmol/L (ref 20–32)
Chloride: 103 mmol/L (ref 98–110)
Creat: 0.69 mg/dL (ref 0.50–1.10)
GFR, EST AFRICAN AMERICAN: 121 mL/min/{1.73_m2} (ref >=60)
GFR, EST NON AFRICAN AMERICAN: 104 mL/min/{1.73_m2} (ref >=60)
GLUCOSE: 257 mg/dL — AB (ref 65–99)
Globulin: 2.6 g/dL (calc) (ref 1.9–3.7)
Potassium: 4.1 mmol/L (ref 3.5–5.3)
Sodium: 135 mmol/L (ref 135–146)
TOTAL PROTEIN: 6.4 g/dL (ref 6.1–8.1)

## 2018-07-04 LAB — FRUCTOSAMINE: Fructosamine: 366 umol/L — ABNORMAL HIGH (ref 205–285)

## 2018-09-20 MED FILL — CIMZIA 200 MG/ML SYRN KIT: 2 X 200 | 28 days supply | Qty: 1 | Fill #0

## 2018-09-27 MED FILL — traMADol HCL 50 MG TABS: 50 | 30 days supply | Qty: 30 | Fill #0

## 2018-10-11 MED FILL — CIMZIA 200 MG/ML STARTER KI: 6 X 200 | 28 days supply | Qty: 3 | Fill #0

## 2018-10-26 ENCOUNTER — Other Ambulatory Visit: Payer: Self-pay | Admitting: Endocrinology

## 2018-10-26 ENCOUNTER — Other Ambulatory Visit: Payer: Self-pay | Admitting: Internal Medicine

## 2018-10-26 MED FILL — glipiZIDE 5 MG TABS: 5 | 90 days supply | Qty: 180 | Fill #0

## 2018-10-26 MED FILL — METFORMIN HCL ER 500 MG TB2: 500 | 30 days supply | Qty: 120 | Fill #0

## 2018-11-05 ENCOUNTER — Ambulatory Visit: Payer: 59 | Admitting: Internal Medicine

## 2018-11-12 DIAGNOSIS — Z0000 Encounter for general adult medical examination without abnormal findings: Secondary | ICD-10-CM | POA: Diagnosis not present

## 2018-11-12 DIAGNOSIS — Z6841 Body Mass Index (BMI) 40.0 and over, adult: Secondary | ICD-10-CM | POA: Diagnosis not present

## 2018-11-12 DIAGNOSIS — Z1329 Encounter for screening for other suspected endocrine disorder: Secondary | ICD-10-CM | POA: Diagnosis not present

## 2018-11-12 MED FILL — FUROSEMIDE 20 MG TABS: 20 | 90 days supply | Qty: 90 | Fill #0

## 2018-11-12 MED FILL — LISINOPRIL 2.5 MG TABLET: 2.5 | 90 days supply | Qty: 90 | Fill #0

## 2018-11-19 DIAGNOSIS — L80 Vitiligo: Secondary | ICD-10-CM | POA: Diagnosis not present

## 2018-11-26 DIAGNOSIS — M791 Myalgia, unspecified site: Secondary | ICD-10-CM | POA: Diagnosis not present

## 2018-11-26 DIAGNOSIS — Z8261 Family history of arthritis: Secondary | ICD-10-CM | POA: Diagnosis not present

## 2018-11-26 DIAGNOSIS — M7989 Other specified soft tissue disorders: Secondary | ICD-10-CM | POA: Diagnosis not present

## 2018-11-26 DIAGNOSIS — M2550 Pain in unspecified joint: Secondary | ICD-10-CM | POA: Diagnosis not present

## 2018-11-26 DIAGNOSIS — M0600 Rheumatoid arthritis without rheumatoid factor, unspecified site: Secondary | ICD-10-CM | POA: Diagnosis not present

## 2018-11-26 DIAGNOSIS — G5601 Carpal tunnel syndrome, right upper limb: Secondary | ICD-10-CM | POA: Diagnosis not present

## 2018-11-26 DIAGNOSIS — L80 Vitiligo: Secondary | ICD-10-CM | POA: Diagnosis not present

## 2018-11-30 DIAGNOSIS — F410 Panic disorder [episodic paroxysmal anxiety] without agoraphobia: Secondary | ICD-10-CM | POA: Diagnosis not present

## 2018-11-30 DIAGNOSIS — Z6841 Body Mass Index (BMI) 40.0 and over, adult: Secondary | ICD-10-CM | POA: Diagnosis not present

## 2018-12-26 ENCOUNTER — Encounter: Payer: Self-pay | Admitting: Gastroenterology

## 2019-02-14 ENCOUNTER — Other Ambulatory Visit: Payer: Self-pay

## 2019-02-18 ENCOUNTER — Encounter

## 2019-02-18 ENCOUNTER — Ambulatory Visit (INDEPENDENT_AMBULATORY_CARE_PROVIDER_SITE_OTHER): Payer: 59 | Admitting: Internal Medicine

## 2019-02-18 ENCOUNTER — Telehealth: Payer: Self-pay

## 2019-02-18 ENCOUNTER — Encounter: Payer: Self-pay | Admitting: Internal Medicine

## 2019-02-18 ENCOUNTER — Other Ambulatory Visit: Payer: Self-pay

## 2019-02-18 VITALS — BP 118/80 | HR 76 | Ht 63.0 in | Wt 228.0 lb

## 2019-02-18 DIAGNOSIS — E1165 Type 2 diabetes mellitus with hyperglycemia: Secondary | ICD-10-CM | POA: Diagnosis not present

## 2019-02-18 DIAGNOSIS — E781 Pure hyperglyceridemia: Secondary | ICD-10-CM | POA: Diagnosis not present

## 2019-02-18 LAB — POCT GLYCOSYLATED HEMOGLOBIN (HGB A1C): Hemoglobin A1C: 8.8 % — AB (ref 4.0–5.6)

## 2019-02-18 MED ORDER — OZEMPIC (0.25 OR 0.5 MG/DOSE) 2 MG/1.5ML ~~LOC~~ SOPN: 0.5000 mg | PEN_INJECTOR | SUBCUTANEOUS | 5 refills | Status: DC

## 2019-02-18 MED FILL — OZEMPIC 0.25 OR 0.5 MG/DOSE: 2 | 56 days supply | Qty: 3 | Fill #0

## 2019-02-18 NOTE — Progress Notes (Signed)
+Patient ID: Morgan Drake, female   DOB: [birth year], 48 y.o.   MRN: [id number]  HPI: Morgan Drake is a 48 y.o.-year-old female, initially referred by Dr. [name] ([facility], ), returning for f/u for DM2, dx as GDM in 1993 and 1998 and DM2 in 1999, non-insulin-dependent, uncontrolled, without long-term complications. Last visit 7.5 months ago.  She continues to have a lot of stress in her life with her mother having dementia.  She finally moved her out in her own apartment.  She is starting to walk more (hiking).  However, sugars remain high.  Reviewed HbA1c levels: 07/02/2018: HbA1c calculated from fructosamine is higher than before, at 7.8% 02/26/2018: HbA1c calculated from fructosamine is much better than the measured one, at 7.4%. 10/16/2017: HbA1c calculated from fructosamine is much lower than the measured one: 7.2%! 07/17/2017: HbA1c calculated from fructosamine is much better: 7.6%! 05/29/2017: HbA1c 10.5% Lab Results  Component Value Date   HGBA1C 8.9 (A) 07/02/2018   HGBA1C 9.5 (A) 02/26/2018   HGBA1C 9.2 (A) 10/16/2017  09/07/2015: 7.1%  Pt was on a regimen of: - Glyxambi 25-5 mg at bedtime (!) - Trulicity 1.5 mg weekly  Currently on:  - Metformin ER 1500-2000 mg with dinner >> [phone] mg with dinner 02/2018 >> occasional diarrhea. - Glipizide 5 mg 2x a day before b'fast and dinner - Farxiga 5 mg before b'fast - started 06/2018 >> stopped months ago - ? reason She stopped Invokana 100 mg in am, before b'fast >> stopped b/c repeated yeast infections. We had to stop Trulicity b/c nausea/vomiting. HbA1c increased afterwards She was on reg. Metformin >> tolerated it well for years, then started to have IBS - like sxs and had to stop. She was on Actos, Avandia. She refused insulin in the past.  Pt checks her sugars 1-2 times a day: - am: 158-160s, 226 >> 105-130 >> 155-190 >> 150-210 - 2h after b'fast: n/c >> 300s after creamer - before lunch:   85, 110-114 >> 99-140 >> n/c - 2h after lunch: n/c - before dinner: 115-120s >> 90s >> n/c - 1-2h after dinner:  150-160s >> <180 >> 200-210 >> 180-220 - bedtime: 110-108 >> n/c - nighttime: n/c Lowest sugar was  90s >> 150; she has hypoglycemia awareness in the 80s. Highest sugar was 210 >> 220.  Glucometer: One Touch Ultra  Pt's meals are: - Breakfast: 1/2 biscuit + lettuce, tomatoes, bacon or bacon or sausage - Lunch: salad + meat  - Dinner: salad + meat + beans or baked potatoes - Snacks: 1: cheese nips or fruit  -No CKD, last BUN/creatinine:  Lab Results  Component Value Date   BUN 10 07/02/2018   BUN 12 12/21/2015   CREATININE 0.69 07/02/2018   CREATININE 0.70 12/21/2015  05/29/2017:CMP normal, except glucose 102.  BUN/creatinine: 10/0.8 On lisinopril.  -+ Hypertriglyceridemia; last set of lipids: Lab Results  Component Value Date   CHOL 283 (H) 07/02/2018   HDL 49.40 07/02/2018   LDLDIRECT 112.0 07/02/2018   TRIG 514.0 (H) 07/02/2018   CHOLHDL 6 07/02/2018  05/29/2017: 244/424/47/n/c 09/07/2015: 203/120/74/105 04/2015: 216/169/59/116  She is on simvastatin 10 mg daily and flaxseed oil twice a day.  She did not tolerate Vascepa.  - last eye exam was in fall 2019: No DR, + small cataract  -No numbness and tingling in her feet.  She had this in the past but it improved after stopping breath.   2 daughters have gluten sensitivity and 1 daughter with  Chrohn ds.  She also has a history of fatty liver, IBS with both constipation and diarrhea, anxiety/depression, GERD, PCOS (she had to have fertility treatment when she got pregnant both times).  She is seeing Dr. Gavin Pound with rheumatology for inflammatory arthritis. She was dx'ed with RA >> on MTX. Pain a little better.   She had anaphylaxis after NSAIDs in the past.  ROS: Constitutional: no weight gain/no weight loss, no fatigue, no subjective hyperthermia, no subjective hypothermia Eyes: no blurry vision, no  xerophthalmia ENT: no sore throat, no nodules palpated in neck, no dysphagia, no odynophagia, no hoarseness Cardiovascular: no CP/no SOB/no palpitations/+ leg swelling Respiratory: no cough/no SOB/no wheezing Gastrointestinal: no N/no V/no D/no C/no acid reflux Musculoskeletal: no muscle aches/no joint aches Skin: no rashes, no hair loss Neurological: no tremors/no numbness/no tingling/no dizziness  I reviewed pt's medications, allergies, PMH, social hx, family hx, and changes were documented in the history of present illness. Otherwise, unchanged from my initial visit note.  Past Medical History:  Diagnosis Date  . Acid reflux   . Atopic dermatitis, mild   . Bell's palsy   . Diabetes mellitus without complication ([place])    type 2  . Diabetic peripheral neuropathy ([place])   . GI bleed   . Hiatal hernia   . Hyperlipidemia   . Hypertriglyceridemia    > 1000  . Metatarsal fracture   . Obesity   . PCOS (polycystic ovarian syndrome)   . Pericarditis 2008   Hospitalized at [facility] overnight, told she had an MI, no cath. Thinks they said pericarditis  . Tonsil stone    Past Surgical History:  Procedure Laterality Date  . ABDOMINAL HYSTERECTOMY    . CESAREAN SECTION    . CHOLECYSTECTOMY     Social History   Social History  . Marital Status: Single    Spouse Name: N/A  . Number of Children: 2   Occupational History  . RN  [employer]   Social History Main Topics  . Smoking status: Former Smoker -- 1.00 packs/day for 22 years    Types: Cigarettes  . Smokeless tobacco: Never Used     Comment: quit 2016  . Alcohol Use: 0.0 oz/week    0 Standard drinks or equivalent per week     Comment: rare  . Drug Use: No   Current Outpatient Medications on File Prior to Visit  Medication Sig Dispense Refill  . ACCU-CHEK GUIDE test strip USE AS INSTRUCTED TO CHECK ONE TIME DAILY. 100 each 3  . Adalimumab (HUMIRA PEN) 40 MG/0.4ML PNKT Inject 1 pen into the skin every 14 (fourteen) days.  As directed every other week subcutaneous 30 days 2 each 2  . Certolizumab Pegol (CIMZIA PREFILLED) 2 X 200 MG/ML KIT Inject 2 syringes every 28 days 1 kit 3  . Certolizumab Pegol (CIMZIA STARTER KIT) 6 X 200 MG/ML KIT Two injections weeks 0, 2, 4 then 2 injections every 4 weeks 1 kit 0  . dapagliflozin propanediol (FARXIGA) 5 MG TABS tablet Take 5 mg by mouth daily. 30 tablet 11  . EPINEPHrine 0.3 mg/0.3 mL IJ SOAJ injection INJ THE CONTENT OF 1 PEN UTD  0  . Flaxseed, Linseed, (FLAX SEED OIL) 1000 MG CAPS Take by mouth.    . folic acid (FOLVITE) 1 MG tablet Take 3 mg by mouth daily.    . furosemide (LASIX) 20 MG tablet Take 20 mg by mouth daily as needed.    Marland Kitchen glipiZIDE (GLUCOTROL) 5 MG  tablet TAKE 1 TABLET BY MOUTH TWICE DAILY 180 tablet 3  . Icosapent Ethyl (VASCEPA) 1 g CAPS Take 2 capsules (2 g total) by mouth 2 (two) times daily. 120 capsule 5  . lisinopril (PRINIVIL,ZESTRIL) 2.5 MG tablet Take 2.5 mg by mouth daily.    . metFORMIN (GLUCOPHAGE-XR) 500 MG 24 hr tablet TAKE 4 TABLETS BY MOUTH DAILY WITH DINNER 360 tablet 3  . Multiple Vitamin (MULTIVITAMIN) tablet Take 1 tablet by mouth daily.    . pantoprazole (PROTONIX) 40 MG tablet TAKE 1 TABLET BY MOUTH TWICE DAILY BEFORE A MEAL 60 tablet 6  . simvastatin (ZOCOR) 10 MG tablet Take 10 mg by mouth at bedtime.    . triamcinolone (NASACORT ALLERGY 24HR) 55 MCG/ACT AERO nasal inhaler Use one spray in each nostril three to seven times weekly as directed. 1 Inhaler 0   No current facility-administered medications on file prior to visit.    Allergies  Allergen Reactions  . Nsaids Anaphylaxis  . Magnesium Citrate Nausea And Vomiting  . Cinnamon Other (See Comments)    Blisters in mouth  . Eggs Or Egg-Derived Products     sensitivity   . Flu Virus Vaccine   . Pineapple Other (See Comments)    Tongue feels like furry spikes    Family History  Problem Relation Age of Onset  . Lung cancer Father   . Hypertension Father   .  Hyperlipidemia Father   . CAD Father   . Heart attack Father   . Diabetes Father   . Mesothelioma Father        Asbestos exposure  . COPD Father   . COPD Other   . Heart attack Brother   . Graves' disease Brother   . Diabetes Brother   . Hyperlipidemia Brother   . Depression Mother   . Heart disease Mother   . Alcoholism Mother   . Hyperlipidemia Mother   . Congestive Heart Failure Mother   . CAD Mother   . Anemia Mother   . GER disease Mother   . Rheum arthritis Mother   . Heart attack Mother   . Ovarian cancer Mother   . COPD Mother   . Diabetes Mother   . Graves' disease Mother   . Irritable bowel syndrome Mother   . Dementia Mother   . CAD Maternal Grandmother   . Congestive Heart Failure Maternal Grandmother   . Diabetes Maternal Grandmother   . Heart attack Maternal Grandmother   . Breast cancer Maternal Grandmother        65-67  . Hyperlipidemia Maternal Grandmother   . Diabetes Paternal Grandmother   . Thyroid cancer Paternal Grandmother   . Esophageal cancer Maternal Grandfather   . Barrett's esophagus Maternal Grandfather   . Heart disease Maternal Grandfather   . Congestive Heart Failure Maternal Grandfather   . COPD Maternal Grandfather   . Hashimoto's thyroiditis Sister   . Diabetes Sister   . Rheum arthritis Sister   . Hyperlipidemia Sister   . Diabetes Paternal Grandfather   . Heart disease Paternal Grandfather   . Hyperlipidemia Paternal Grandfather   . Diabetes Sister   . Hyperlipidemia Sister   . Hypothyroidism Sister    PE: There were no vitals taken for this visit. Wt Readings from Last 3 Encounters:  07/02/18 228 lb (103.4 kg)  04/12/18 226 lb 9.6 oz (102.8 kg)  03/05/18 228 lb 9.6 oz (103.7 kg)   Constitutional: overweight, in NAD Eyes: PERRLA, EOMI, no exophthalmos ENT:  moist mucous membranes, no thyromegaly, no cervical lymphadenopathy Cardiovascular: RRR, No MRG, + bilateral pitting edema up to the knees Respiratory: CTA  B Gastrointestinal: abdomen soft, NT, ND, BS+ Musculoskeletal: no deformities, strength intact in all 4 Skin: moist, warm, no rashes Neurological: no tremor with outstretched hands, DTR normal in all 4  ASSESSMENT: 1. DM2, non-insulin-dependent, uncontrolled, without long term complications, but with hyperglycemia - we stopped Trulicity b/c N/V, Invokana b/c Yeast vaginitis  2. HTG  3. Obesity class 3  4.  Family history of thyroid disease -Graves' disease in mother and brother -Hypothyroidism in sister  PLAN:  1. Patient with longstanding, uncontrolled, type 2 diabetes, worsened before last visit after her anaphylaxis episode to NSAIDs.  Also, she had a period of time last year in which she was homeless and missing many medications.  At last visit, sugars were high and we added an SGLT 2 inhibitor.  I suggested Wilder Glade, since she had yeast infections on Invokana in the past.  However, at this visit, she tells me that she was taking [place] but does not remember what happened and she came off approximately 6 months ago... -At this visit, sugars are still high, so I suggested to add Ozempic.  She had nausea and vomiting with Trulicity, but I am hoping that she can tolerate Ozempic better.  If not, we will need long-acting insulin.  We discussed about the many benefits of Ozempic, beyond improved diabetes control:  weight loss, improved cardiovascular and renal outcomes - I suggested to:  Patient Instructions  Please continue: - Metformin ER [phone] mg with dinner. - Glipizide 5 mg 2x a day before breakfast and dinner  Please start Ozempic 0.25 mg weekly in a.m. (for example on Sunday morning) x 4 weeks, then increase to 0.5 mg weekly in a.m. if no nausea or hypoglycemia.  Please return in 3-4 months with your sugar log.   - we would check a fructosamine level at next visit.  Today, HbA1c was still 8.8%, only slightly improved from before. - advised to check sugars at different times  of the day - 1x a day, rotating check times - advised for yearly eye exams >> she is UTD, but needs one soon - return to clinic in 3-4 months   2. HTG -Reviewed latest lipid panel from last visit, triglycerides quite high, in the 500s -Continues Zocor and flaxseed oil without side effects  3. Obesity class 3 -Continues to work on exercising-hiking -We will add Ozempic, which should also help with weight loss  Philemon Kingdom, MD PhD [facility]

## 2019-02-18 NOTE — Telephone Encounter (Signed)
Patient called in wanting to know to if you could send her script for her Meter, Strips , and lances to [facility]. Her insurance is making her change the meter she normally uses

## 2019-02-18 NOTE — Patient Instructions (Addendum)
Please continue: - Metformin ER [phone] mg with dinner. - Glipizide 5 mg 2x a day before breakfast and dinner  Please start Ozempic 0.25 mg weekly in a.m. (for example on Sunday morning) x 4 weeks, then increase to 0.5 mg weekly in a.m. if no nausea or hypoglycemia.  Please return in 3-4 months with your sugar log.

## 2019-02-20 MED ORDER — ACCU-CHEK GUIDE VI STRP: ORAL_STRIP | 3 refills | Status: DC

## 2019-02-22 ENCOUNTER — Other Ambulatory Visit: Payer: Self-pay

## 2019-02-22 MED ORDER — FREESTYLE LANCETS MISC: 12 refills | Status: DC

## 2019-02-22 MED ORDER — FREESTYLE LITE TEST VI STRP: ORAL_STRIP | 12 refills | Status: AC

## 2019-02-22 MED ORDER — FREESTYLE LITE DEVI: 0 refills | Status: DC

## 2019-02-22 MED FILL — FREESTYLE LANCETS: 90 days supply | Qty: 100 | Fill #0

## 2019-02-22 MED FILL — FREESTYLE LITE TEST STRIP: 90 days supply | Qty: 100 | Fill #0

## 2019-02-22 MED FILL — FREESTYLE LITE METER: 20 days supply | Qty: 1 | Fill #0

## 2019-02-22 NOTE — Telephone Encounter (Signed)
Fax from patient pharmacy stating Accu-chek products are not covered, only Freestyle.  New meter, strips and lancets sent.

## 2019-02-25 ENCOUNTER — Other Ambulatory Visit: Payer: Self-pay | Admitting: Internal Medicine

## 2019-02-25 ENCOUNTER — Other Ambulatory Visit: Payer: Self-pay | Admitting: Family Medicine

## 2019-02-25 DIAGNOSIS — Z1231 Encounter for screening mammogram for malignant neoplasm of breast: Secondary | ICD-10-CM

## 2019-02-25 MED FILL — glipiZIDE 5 MG TABS: 5 | 90 days supply | Qty: 180 | Fill #1

## 2019-02-25 MED FILL — metFORMIN HCL ER 500 MG TB2: 500 | 30 days supply | Qty: 120 | Fill #1

## 2019-02-26 ENCOUNTER — Other Ambulatory Visit: Payer: Self-pay

## 2019-02-26 MED ORDER — PANTOPRAZOLE SODIUM 40 MG PO TBEC: DELAYED_RELEASE_TABLET | ORAL | 6 refills | Status: AC

## 2019-02-26 MED FILL — PANTOPRAZOLE SOD DR 40 MG T: 40 | 30 days supply | Qty: 60 | Fill #0

## 2019-02-28 DIAGNOSIS — M791 Myalgia, unspecified site: Secondary | ICD-10-CM | POA: Diagnosis not present

## 2019-02-28 DIAGNOSIS — Z6841 Body Mass Index (BMI) 40.0 and over, adult: Secondary | ICD-10-CM | POA: Diagnosis not present

## 2019-02-28 DIAGNOSIS — L80 Vitiligo: Secondary | ICD-10-CM | POA: Diagnosis not present

## 2019-02-28 DIAGNOSIS — E119 Type 2 diabetes mellitus without complications: Secondary | ICD-10-CM | POA: Diagnosis not present

## 2019-02-28 DIAGNOSIS — G5601 Carpal tunnel syndrome, right upper limb: Secondary | ICD-10-CM | POA: Diagnosis not present

## 2019-02-28 DIAGNOSIS — M2550 Pain in unspecified joint: Secondary | ICD-10-CM | POA: Diagnosis not present

## 2019-02-28 DIAGNOSIS — Z8261 Family history of arthritis: Secondary | ICD-10-CM | POA: Diagnosis not present

## 2019-02-28 DIAGNOSIS — M0600 Rheumatoid arthritis without rheumatoid factor, unspecified site: Secondary | ICD-10-CM | POA: Diagnosis not present

## 2019-02-28 DIAGNOSIS — M7989 Other specified soft tissue disorders: Secondary | ICD-10-CM | POA: Diagnosis not present

## 2019-03-22 MED FILL — metFORMIN HCL ER 500 MG TB2: 500 | 30 days supply | Qty: 120 | Fill #2

## 2019-03-22 MED FILL — LORazepam 0.5 MG TABS: 0.5 | 30 days supply | Qty: 30 | Fill #0

## 2019-04-16 ENCOUNTER — Ambulatory Visit: Payer: 59

## 2019-04-16 ENCOUNTER — Other Ambulatory Visit: Payer: Self-pay

## 2019-04-16 ENCOUNTER — Ambulatory Visit
Admission: RE | Admit: 2019-04-16 | Discharge: 2019-04-16 | Disposition: A | Discharge status: Home / Self Care | Payer: 59 | Source: Ambulatory Visit | Attending: Family Medicine | Admitting: Family Medicine

## 2019-04-16 DIAGNOSIS — Z1231 Encounter for screening mammogram for malignant neoplasm of breast: Secondary | ICD-10-CM | POA: Diagnosis not present

## 2019-04-17 MED FILL — OZEMPIC 0.25 OR 0.5 MG/DOSE: 2 | 56 days supply | Qty: 3 | Fill #1

## 2019-05-30 DIAGNOSIS — G5601 Carpal tunnel syndrome, right upper limb: Secondary | ICD-10-CM | POA: Diagnosis not present

## 2019-05-30 DIAGNOSIS — M0600 Rheumatoid arthritis without rheumatoid factor, unspecified site: Secondary | ICD-10-CM | POA: Diagnosis not present

## 2019-05-30 DIAGNOSIS — M791 Myalgia, unspecified site: Secondary | ICD-10-CM | POA: Diagnosis not present

## 2019-05-30 DIAGNOSIS — M7989 Other specified soft tissue disorders: Secondary | ICD-10-CM | POA: Diagnosis not present

## 2019-05-30 DIAGNOSIS — M2550 Pain in unspecified joint: Secondary | ICD-10-CM | POA: Diagnosis not present

## 2019-05-30 DIAGNOSIS — Z8261 Family history of arthritis: Secondary | ICD-10-CM | POA: Diagnosis not present

## 2019-05-30 DIAGNOSIS — L80 Vitiligo: Secondary | ICD-10-CM | POA: Diagnosis not present

## 2019-05-30 DIAGNOSIS — Z6841 Body Mass Index (BMI) 40.0 and over, adult: Secondary | ICD-10-CM | POA: Diagnosis not present

## 2019-05-30 MED FILL — LEFLUNOMIDE 20 MG TABLET: 20 | 30 days supply | Qty: 30 | Fill #0

## 2019-05-30 MED FILL — HYDROCODON-APAP 5-325: 5-325 | 30 days supply | Qty: 30 | Fill #0

## 2019-06-25 MED FILL — glipiZIDE 5 MG TABS: 5 | 90 days supply | Qty: 180 | Fill #2

## 2019-06-25 MED FILL — FUROSEMIDE 20 MG TABS: 20 | 90 days supply | Qty: 90 | Fill #1

## 2019-06-25 MED FILL — LEFLUNOMIDE 20 MG TABLET: 20 | 30 days supply | Qty: 30 | Fill #1

## 2019-06-25 MED FILL — OZEMPIC 0.25 OR 0.5 MG/DOSE: 2 | 28 days supply | Qty: 2 | Fill #2

## 2019-06-25 MED FILL — LISINOPRIL 2.5 MG TABLET: 2.5 | 90 days supply | Qty: 90 | Fill #1

## 2019-06-25 MED FILL — metFORMIN HCL ER 500 MG TB2: 500 | 30 days supply | Qty: 120 | Fill #3

## 2019-06-25 MED FILL — PANTOPRAZOLE SOD DR 40 MG T: 40 | 30 days supply | Qty: 60 | Fill #1

## 2019-07-11 DIAGNOSIS — E119 Type 2 diabetes mellitus without complications: Secondary | ICD-10-CM | POA: Diagnosis not present

## 2019-07-11 DIAGNOSIS — M0600 Rheumatoid arthritis without rheumatoid factor, unspecified site: Secondary | ICD-10-CM | POA: Diagnosis not present

## 2019-07-12 ENCOUNTER — Ambulatory Visit (INDEPENDENT_AMBULATORY_CARE_PROVIDER_SITE_OTHER): Payer: 59 | Admitting: Cardiovascular Disease

## 2019-07-12 ENCOUNTER — Encounter: Payer: Self-pay | Admitting: Cardiovascular Disease

## 2019-07-12 ENCOUNTER — Other Ambulatory Visit: Payer: Self-pay

## 2019-07-12 VITALS — BP 122/85 | HR 77 | Temp 97.3°F | Ht 62.5 in | Wt 218.0 lb

## 2019-07-12 DIAGNOSIS — E669 Obesity, unspecified: Secondary | ICD-10-CM

## 2019-07-12 DIAGNOSIS — E1169 Type 2 diabetes mellitus with other specified complication: Secondary | ICD-10-CM

## 2019-07-12 DIAGNOSIS — E782 Mixed hyperlipidemia: Secondary | ICD-10-CM

## 2019-07-12 NOTE — Progress Notes (Signed)
Cardiology Office Note:    Date:  07/18/2019   ID:  Morgan Drake, DOB 09-17-1970, MRN [id number]  PCP:  Ronita Hipps, MD  Cardiologist:  Sanda Klein, MD  Electrophysiologist:  None   Referring MD: Ronita Hipps, MD   No chief complaint on file.   History of Present Illness:    Morgan Drake is a 49 y.o. female with type 2 diabetes and hyperlipidemia, a strong family hx of premature onset cardiac illness, here for follow-up.  She has had a difficult year due to numerous tragedies in her family.  She is also struggling with noncardiac health issues.  Humira did not work for her arthritis, methotrexate caused alopecia and she is now on leflunomide Morgan Drake).  She sees Dr. Lenna Gilford and Marella Chimes, [place] in the [facility] rheumatology office.  Morgan Drake has severe mixed hyper lipidemia with triglycerides that have been over thousand.  She has insulin resistance/type 2 diabetes mellitus and PCOS.  Wilder Glade did not work for her diabetes, she has been started on Ozempic and this seems to be working well for her.  The most recent hemoglobin A1c I have available was 9.2%, but she believes that this will be better soon based on her fingerstick results.  LDL cholesterol was 112, triglycerides 503 on labs performed in July.  She is intolerant to both Crestor and Lipitor and higher doses of simvastatin.  Fish oil supplements caused acid reflux so she started taking flaxseed oil.  Did not tolerate Vascepa.  She does not have coronary artery disease based on a CT angiogram in 2014, calcium score was 0. She had a normal nuclear stress test in October 2016.  She had an essentially normal Holter monitor in September 2017.  Normal echocardiogram in December 2019 except for "grade 1 diastolic dysfunction" (average e' was 11.27, which is firmly normal range; the diastolic dysfunction pattern was likely due to hypovolemia).  Her mother was told that she had "pediatric-size blood vessels" and developed  congestive heart failure due to cardiomyopathy.  She had a myocardial infarction at age 79 and eventually had bypass surgery.  Her father had a myocardial infarction at age 78.  She has a younger brother who had bypass surgery at age 68.  Past Medical History:  Diagnosis Date  . Acid reflux   . Atopic dermatitis, mild   . Bell's palsy   . Diabetes mellitus without complication ([place])    type 2  . Diabetic peripheral neuropathy ([place])   . GI bleed   . Hiatal hernia   . Hyperlipidemia   . Hypertriglyceridemia    > 1000  . Metatarsal fracture   . Obesity   . PCOS (polycystic ovarian syndrome)   . Pericarditis 2008   Hospitalized at [facility] overnight, told she had an MI, no cath. Thinks they said pericarditis  . Tonsil stone     Past Surgical History:  Procedure Laterality Date  . ABDOMINAL HYSTERECTOMY    . CESAREAN SECTION    . CHOLECYSTECTOMY      Current Medications: Current Meds  Medication Sig  . Blood Glucose Monitoring Suppl (FREESTYLE LITE) DEVI Use to check blood sugar once a day.  Marland Kitchen EPINEPHrine 0.3 mg/0.3 mL IJ SOAJ injection INJ THE CONTENT OF 1 PEN UTD  . folic acid (FOLVITE) 1 MG tablet Take 3 mg by mouth daily.  . furosemide (LASIX) 20 MG tablet Take 20 mg by mouth daily as needed.  Marland Kitchen glipiZIDE (GLUCOTROL) 5 MG tablet  TAKE 1 TABLET BY MOUTH TWICE DAILY  . glucose blood (FREESTYLE LITE) test strip Use to check blood sugar once a day.  Marland Kitchen HYDROcodone-acetaminophen (NORCO/VICODIN) 5-325 MG tablet   . Lancets (FREESTYLE) lancets Use to check blood sugar once a day  . leflunomide (ARAVA) 20 MG tablet Take 20 mg by mouth daily.  Marland Kitchen lisinopril (PRINIVIL,ZESTRIL) 2.5 MG tablet Take 2.5 mg by mouth daily.  Marland Kitchen LORazepam (ATIVAN) 0.5 MG tablet   . metFORMIN (GLUCOPHAGE-XR) 500 MG 24 hr tablet TAKE 4 TABLETS BY MOUTH DAILY WITH DINNER  . Multiple Vitamin (MULTIVITAMIN) tablet Take 1 tablet by mouth daily.  . NON FORMULARY   . pantoprazole (PROTONIX) 40 MG tablet TAKE 1 TABLET  BY MOUTH TWICE DAILY BEFORE A MEAL  . Semaglutide,0.25 or 0.5MG/DOS, (OZEMPIC, 0.25 OR 0.5 MG/DOSE,) 2 MG/1.5ML SOPN Inject 0.5 mg into the skin once a week.  . simvastatin (ZOCOR) 10 MG tablet Take 10 mg by mouth at bedtime.  . triamcinolone (NASACORT ALLERGY 24HR) 55 MCG/ACT AERO nasal inhaler Use one spray in each nostril three to seven times weekly as directed.  . [DISCONTINUED] Adalimumab (HUMIRA PEN) 40 MG/0.4ML PNKT Inject 1 pen into the skin every 14 (fourteen) days. As directed every other week subcutaneous 30 days  . [DISCONTINUED] Certolizumab Pegol (CIMZIA PREFILLED) 2 X 200 MG/ML KIT Inject 2 syringes every 28 days  . [DISCONTINUED] Certolizumab Pegol (CIMZIA STARTER KIT) 6 X 200 MG/ML KIT Two injections weeks 0, 2, 4 then 2 injections every 4 weeks  . [DISCONTINUED] Flaxseed, Linseed, (FLAX SEED OIL) 1000 MG CAPS Take by mouth.  . [DISCONTINUED] Icosapent Ethyl (VASCEPA) 1 g CAPS Take 2 capsules (2 g total) by mouth 2 (two) times daily.     Allergies:   Nsaids, Magnesium citrate, Cinnamon, Eggs or egg-derived products, Flu virus vaccine, Other, and Pineapple   Social History   Socioeconomic History  . Marital status: Single    Spouse name: Not on file  . Number of children: Not on file  . Years of education: Not on file  . Highest education level: Not on file  Occupational History  . Occupation: Optician, dispensing: [place]  Tobacco Use  . Smoking status: Former Smoker    Packs/day: 1.00    Years: 22.00    Pack years: 22.00    Types: Cigarettes  . Smokeless tobacco: Never Used  . Tobacco comment: quit december 2015  Substance and Sexual Activity  . Alcohol use: Not Currently    Alcohol/week: 0.0 standard drinks  . Drug use: Never  . Sexual activity: Not on file  Other Topics Concern  . Not on file  Social History Narrative  . Not on file   Social Determinants of Health   Financial Resource Strain:   . Difficulty of Paying Living Expenses:   Food  Insecurity:   . Worried About Charity fundraiser in the Last Year:   . Arboriculturist in the Last Year:   Transportation Needs:   . Film/video editor (Medical):   Marland Kitchen Lack of Transportation (Non-Medical):   Physical Activity:   . Days of Exercise per Week:   . Minutes of Exercise per Session:   Stress:   . Feeling of Stress :   Social Connections:   . Frequency of Communication with Friends and Family:   . Frequency of Social Gatherings with Friends and Family:   . Attends Religious Services:   . Active Member of Clubs or  Organizations:   . Attends Archivist Meetings:   Marland Kitchen Marital Status:      Family History: The patient's family history includes Alcoholism in her mother; Anemia in her mother; Barrett's esophagus in her maternal grandfather; Breast cancer in her maternal grandmother; CAD in her father, maternal grandmother, and mother; COPD in her father, maternal grandfather, mother, and another family member; Congestive Heart Failure in her maternal grandfather, maternal grandmother, and mother; Dementia in her mother; Depression in her mother; Diabetes in her brother, father, maternal grandmother, mother, paternal grandfather, paternal grandmother, sister, and sister; Esophageal cancer in her maternal grandfather; GER disease in her mother; Berenice Primas' disease in her brother and mother; Hashimoto's thyroiditis in her sister; Heart attack in her brother, father, maternal grandmother, and mother; Heart disease in her maternal grandfather, mother, and paternal grandfather; Hyperlipidemia in her brother, father, maternal grandmother, mother, paternal grandfather, sister, and sister; Hypertension in her father; Hypothyroidism in her sister; Irritable bowel syndrome in her mother; Lung cancer in her father; Mesothelioma in her father; Ovarian cancer in her mother; Rheum arthritis in her mother and sister; Thyroid cancer in her paternal grandmother.  ROS:   Please see the history of  present illness.     All other systems reviewed and are negative.  EKGs/Labs/Other Studies Reviewed:    The following studies were reviewed today: Coronary CTA and nuclear stress test  ECHO 04/30/2018:  - Left ventricle: The cavity size was normal. Systolic function was  normal. The estimated ejection fraction was in the range of 50%  to 55%. Wall motion was normal; there were no regional wall  motion abnormalities. Doppler parameters are consistent with  abnormal left ventricular relaxation (grade 1 diastolic  dysfunction).  - Aortic valve: Transvalvular velocity was within the normal range.  There was no stenosis. There was no regurgitation.  - Mitral valve: Transvalvular velocity was within the normal range.  There was no evidence for stenosis. There was trivial  regurgitation.  - Left atrium: The atrium was mildly dilated.  - Right ventricle: The cavity size was normal. Wall thickness was  normal. Systolic function was normal.  - Atrial septum: No defect or patent foramen ovale was identified  by color flow Doppler.  - Tricuspid valve: There was mild regurgitation.  - Pulmonary arteries: Systolic pressure was within the normal  range. PA peak pressure: 27 mm Hg (S).   EKG:  EKG is ordered today.  It shows normal sinus rhythm, normal tracing  Recent Labs: No results found for requested labs within last 8760 hours.  Labs from November 12, 2018 Creatinine 0.63, potassium 4.7, normal liver function test, normal TSH, hemoglobin A1c 9.2%, hemoglobin 13.1  Recent Lipid Panel    Component Value Date/Time   CHOL 283 (H) 07/02/2018 0836   TRIG 514.0 (H) 07/02/2018 0836   HDL 49.40 07/02/2018 0836   CHOLHDL 6 07/02/2018 0836   LDLDIRECT 112.0 07/02/2018 0836    Labs from October 27, 2017 Total cholesterol 311, triglycerides 503, HDL 57 LDL 112  Physical Exam:    VS:  BP 122/85   Pulse 77   Temp (!) 97.3 F (36.3 C)   Ht 5' 2.5" (1.588 m)   Wt 218 lb  (98.9 kg)   SpO2 95%   BMI 39.24 kg/m     Wt Readings from Last 3 Encounters:  07/12/19 218 lb (98.9 kg)  02/18/19 228 lb (103.4 kg)  07/02/18 228 lb (103.4 kg)     General: Alert, oriented x3,  no distress, severely obese Head: no evidence of trauma, PERRL, EOMI, no exophtalmos or lid lag, no myxedema, no xanthelasma; normal ears, nose and oropharynx Neck: normal jugular venous pulsations and no hepatojugular reflux; brisk carotid pulses without delay and no carotid bruits Chest: clear to auscultation, no signs of consolidation by percussion or palpation, normal fremitus, symmetrical and full respiratory excursions Cardiovascular: normal position and quality of the apical impulse, regular rhythm, normal first and second heart sounds, no murmurs, rubs or gallops Abdomen: no tenderness or distention, no masses by palpation, no abnormal pulsatility or arterial bruits, normal bowel sounds, no hepatosplenomegaly Extremities: no clubbing, cyanosis or edema; 2+ radial, ulnar and brachial pulses bilaterally; 2+ right femoral, posterior tibial and dorsalis pedis pulses; 2+ left femoral, posterior tibial and dorsalis pedis pulses; no subclavian or femoral bruits Neurological: grossly nonfocal Psych: Normal mood and affect   ASSESSMENT:    1. Mixed hyperlipidemia   2. Severe obesity (BMI 35.0-39.9) with comorbidity ([place])   3. Diabetes mellitus type 2 in obese [facility])    PLAN:    In order of problems listed above:  1. Mixed hyperlipidemia: The focus remains on improved glycemic control, hopefully will get better on the GLP-1 agonist.  She has started to lose some weight since taking this medication. 2. Morbid obesity: Has moved down to the "severe obesity" range based on today's weight.  She believes that the weight loss is continuing.  She has numerous features of the metabolic syndrome, full-blown diabetes mellitus and PCOS.  Reviewed the importance of restricting carbohydrate intake as well as  exercise. 3. DM: Poorly controlled, until recently.    Medication Adjustments/Labs and Tests Ordered: Current medicines are reviewed at length with the patient today.  Concerns regarding medicines are outlined above.  Orders Placed This Encounter  Procedures  . EKG 12-Lead   No orders of the defined types were placed in this encounter.   Patient Instructions  Medication Instructions:  No changes *If you need a refill on your cardiac medications before your next appointment, please call your pharmacy*   Lab Work: None ordered If you have labs (blood work) drawn today and your tests are completely normal, you will receive your results only by: Marland Kitchen MyChart Message (if you have MyChart) OR . A paper copy in the mail If you have any lab test that is abnormal or we need to change your treatment, we will call you to review the results.   Testing/Procedures: None ordered   Follow-Up: At [facility], you and your health needs are our priority.  As part of our continuing mission to provide you with exceptional heart care, we have created designated Provider Care Teams.  These Care Teams include your primary Cardiologist (physician) and Advanced Practice Providers (APPs -  Physician Assistants and Nurse Practitioners) who all work together to provide you with the care you need, when you need it.  We recommend signing up for the patient portal called "MyChart".  Sign up information is provided on this After Visit Summary.  MyChart is used to connect with patients for Virtual Visits (Telemedicine).  Patients are able to view lab/test results, encounter notes, upcoming appointments, etc.  Non-urgent messages can be sent to your provider as well.   To learn more about what you can do with MyChart, go to [email or web address].    Your next appointment:   12 month(s)  The format for your next appointment:   In Person  Provider:   You may see Stephenia Vogan,  MD or one of the following  Advanced Practice Providers on your designated Care Team:    Almyra Deforest, PA-C  Fabian Sharp, [place] or   Roby Lofts, PA-C       Signed, Sanda Klein, MD  07/18/2019 2:46 PM    [place]

## 2019-07-12 NOTE — Patient Instructions (Signed)

## 2019-07-15 DIAGNOSIS — M25562 Pain in left knee: Secondary | ICD-10-CM | POA: Diagnosis not present

## 2019-07-18 ENCOUNTER — Encounter: Payer: Self-pay | Admitting: Cardiovascular Disease

## 2019-07-18 DIAGNOSIS — R7612 Nonspecific reaction to cell mediated immunity measurement of gamma interferon antigen response without active tuberculosis: Secondary | ICD-10-CM | POA: Diagnosis not present

## 2019-07-18 DIAGNOSIS — R7611 Nonspecific reaction to tuberculin skin test without active tuberculosis: Secondary | ICD-10-CM | POA: Diagnosis not present

## 2019-07-23 MED FILL — OZEMPIC 0.25 OR 0.5 MG/DOSE: 2 | 28 days supply | Qty: 2 | Fill #3

## 2019-07-23 MED FILL — LEFLUNOMIDE 20 MG TABLET: 20 | 30 days supply | Qty: 30 | Fill #2

## 2019-07-23 MED FILL — PANTOPRAZOLE SOD DR 40 MG T: 40 | 30 days supply | Qty: 60 | Fill #2

## 2019-07-23 MED FILL — metFORMIN HCL ER 500 MG TB2: 500 | 30 days supply | Qty: 120 | Fill #4

## 2019-08-01 DIAGNOSIS — Z1331 Encounter for screening for depression: Secondary | ICD-10-CM | POA: Diagnosis not present

## 2019-08-01 DIAGNOSIS — F410 Panic disorder [episodic paroxysmal anxiety] without agoraphobia: Secondary | ICD-10-CM | POA: Diagnosis not present

## 2019-08-01 DIAGNOSIS — Z6839 Body mass index (BMI) 39.0-39.9, adult: Secondary | ICD-10-CM | POA: Diagnosis not present

## 2019-08-01 DIAGNOSIS — R7611 Nonspecific reaction to tuberculin skin test without active tuberculosis: Secondary | ICD-10-CM | POA: Diagnosis not present

## 2019-08-01 DIAGNOSIS — I10 Essential (primary) hypertension: Secondary | ICD-10-CM | POA: Diagnosis not present

## 2019-08-01 MED FILL — LORazepam 0.5 MG TABS: 0.5 | 90 days supply | Qty: 90 | Fill #0

## 2019-08-01 MED FILL — LISINOPRIL 5 MG TABLET: 5 | 90 days supply | Qty: 90 | Fill #0

## 2019-08-03 DIAGNOSIS — M25562 Pain in left knee: Secondary | ICD-10-CM | POA: Diagnosis not present

## 2019-08-16 ENCOUNTER — Other Ambulatory Visit: Payer: Self-pay

## 2019-08-19 ENCOUNTER — Encounter: Payer: Self-pay | Admitting: Cardiovascular Disease

## 2019-08-19 ENCOUNTER — Ambulatory Visit: Payer: 59 | Admitting: Infectious Disease

## 2019-08-19 ENCOUNTER — Other Ambulatory Visit: Payer: Self-pay

## 2019-08-19 ENCOUNTER — Encounter: Payer: Self-pay | Admitting: Infectious Disease

## 2019-08-19 VITALS — BP 126/84 | HR 78 | Wt 217.0 lb

## 2019-08-19 DIAGNOSIS — M069 Rheumatoid arthritis, unspecified: Secondary | ICD-10-CM | POA: Diagnosis not present

## 2019-08-19 DIAGNOSIS — Z227 Latent tuberculosis: Secondary | ICD-10-CM | POA: Diagnosis not present

## 2019-08-19 DIAGNOSIS — E1165 Type 2 diabetes mellitus with hyperglycemia: Secondary | ICD-10-CM | POA: Diagnosis not present

## 2019-08-19 HISTORY — DX: Latent tuberculosis: Z22.7

## 2019-08-19 NOTE — Progress Notes (Signed)
Subjective:   Reason for infectious disease consult: Positive QuantiFERON gold and patient with rheumatoid arthritis  Requesting physician Lynley    Patient ID: Morgan Drake, female    DOB: 11-Feb-1971, 49 y.o.   MRN: [id number]  HPI  Morgan Drake is a 49 year old Caucasian female with a history of rheumatoid arthritis being treated by Dr. Gavin Pound with [facility] rheumatology.  She has tried various agents fluting Humira more recently leflunomide.  She recently had a QuantiFERON gold test done as part of routine testing while on immunosuppressive therapy.  This was positive she remembers having exposure to a patient with tuberculosis in 2006 without PPE because the patient was initially admitted without anyone having an anxiety or concerned that the patient might have TB.  She had always tested negative since then.  She had a dust x-ray performed at [facility] which was negative for any infiltrates.  She has no other symptoms to suggest deep TB infection.  She does not have an HIV test recently but was tested after her first marriage.  She has had 2 lifetime partners the first of which was unfaithful to her.  The second of which she split with some 3 years ago.   That partner had a history of substance abuse the no IV drug use.  She agreed to HIV testing as well as testing for hep B and C I will test for hep A for thoroughness in case one of the other viral hepatitides is active and we have need of immunization  He has had several severe allergic reactions including 1 to the preservative in the hummus caused anaphylaxis.  He therefore would like to just do 1 pill for her latent tuberculosis  Past Medical History:  Diagnosis Date  . Acid reflux   . Atopic dermatitis, mild   . Bell's palsy   . Diabetes mellitus without complication ([place])    type 2  . Diabetic peripheral neuropathy ([place])   . GI bleed   . Hiatal hernia   . Hyperlipidemia   . Hypertriglyceridemia    > 1000    . Metatarsal fracture   . Obesity   . PCOS (polycystic ovarian syndrome)   . Pericarditis 2008   Hospitalized at [facility] overnight, told she had an MI, no cath. Thinks they said pericarditis  . TB lung, latent 08/19/2019  . Tonsil stone     Past Surgical History:  Procedure Laterality Date  . ABDOMINAL HYSTERECTOMY    . CESAREAN SECTION    . CHOLECYSTECTOMY      Family History  Problem Relation Age of Onset  . Lung cancer Father   . Hypertension Father   . Hyperlipidemia Father   . CAD Father   . Heart attack Father   . Diabetes Father   . Mesothelioma Father        Asbestos exposure  . COPD Father   . COPD Other   . Heart attack Brother   . Graves' disease Brother   . Diabetes Brother   . Hyperlipidemia Brother   . Depression Mother   . Heart disease Mother   . Alcoholism Mother   . Hyperlipidemia Mother   . Congestive Heart Failure Mother   . CAD Mother   . Anemia Mother   . GER disease Mother   . Rheum arthritis Mother   . Heart attack Mother   . Ovarian cancer Mother   . COPD Mother   . Diabetes Mother   . Graves' disease  Mother   . Irritable bowel syndrome Mother   . Dementia Mother   . CAD Maternal Grandmother   . Congestive Heart Failure Maternal Grandmother   . Diabetes Maternal Grandmother   . Heart attack Maternal Grandmother   . Breast cancer Maternal Grandmother        65-67  . Hyperlipidemia Maternal Grandmother   . Diabetes Paternal Grandmother   . Thyroid cancer Paternal Grandmother   . Esophageal cancer Maternal Grandfather   . Barrett's esophagus Maternal Grandfather   . Heart disease Maternal Grandfather   . Congestive Heart Failure Maternal Grandfather   . COPD Maternal Grandfather   . Hashimoto's thyroiditis Sister   . Diabetes Sister   . Rheum arthritis Sister   . Hyperlipidemia Sister   . Diabetes Paternal Grandfather   . Heart disease Paternal Grandfather   . Hyperlipidemia Paternal Grandfather   . Diabetes Sister   .  Hyperlipidemia Sister   . Hypothyroidism Sister       Social History   Socioeconomic History  . Marital status: Single    Spouse name: Not on file  . Number of children: Not on file  . Years of education: Not on file  . Highest education level: Not on file  Occupational History  . Occupation: Optician, dispensing: [employer]  Tobacco Use  . Smoking status: Former Smoker    Packs/day: 1.00    Years: 22.00    Pack years: 22.00    Types: Cigarettes  . Smokeless tobacco: Never Used  . Tobacco comment: quit december 2015  Substance and Sexual Activity  . Alcohol use: Not Currently    Alcohol/week: 0.0 standard drinks  . Drug use: Never  . Sexual activity: Not on file  Other Topics Concern  . Not on file  Social History Narrative  . Not on file   Social Determinants of Health   Financial Resource Strain:   . Difficulty of Paying Living Expenses:   Food Insecurity:   . Worried About Charity fundraiser in the Last Year:   . Arboriculturist in the Last Year:   Transportation Needs:   . Film/video editor (Medical):   Marland Kitchen Lack of Transportation (Non-Medical):   Physical Activity:   . Days of Exercise per Week:   . Minutes of Exercise per Session:   Stress:   . Feeling of Stress :   Social Connections:   . Frequency of Communication with Friends and Family:   . Frequency of Social Gatherings with Friends and Family:   . Attends Religious Services:   . Active Member of Clubs or Organizations:   . Attends Archivist Meetings:   Marland Kitchen Marital Status:     Allergies  Allergen Reactions  . Nsaids Anaphylaxis  . Magnesium Citrate Nausea And Vomiting  . Cinnamon Other (See Comments)    Blisters in mouth  . Eggs Or Egg-Derived Products     sensitivity   . Flu Virus Vaccine   . Other Swelling    mint  . Pineapple Other (See Comments)    Tongue feels like furry spikes      Current Outpatient Medications:  .  Blood Glucose Monitoring Suppl (FREESTYLE LITE)  DEVI, Use to check blood sugar once a day., Disp: 1 kit, Rfl: 0 .  EPINEPHrine 0.3 mg/0.3 mL IJ SOAJ injection, INJ THE CONTENT OF 1 PEN UTD, Disp: , Rfl: 0 .  folic acid (FOLVITE) 1 MG tablet, Take 3 mg  by mouth daily., Disp: , Rfl:  .  furosemide (LASIX) 20 MG tablet, Take 20 mg by mouth daily as needed., Disp: , Rfl:  .  glipiZIDE (GLUCOTROL) 5 MG tablet, TAKE 1 TABLET BY MOUTH TWICE DAILY, Disp: 180 tablet, Rfl: 3 .  glucose blood (FREESTYLE LITE) test strip, Use to check blood sugar once a day., Disp: 100 each, Rfl: 12 .  HYDROcodone-acetaminophen (NORCO/VICODIN) 5-325 MG tablet, , Disp: , Rfl:  .  Lancets (FREESTYLE) lancets, Use to check blood sugar once a day, Disp: 100 each, Rfl: 12 .  leflunomide (ARAVA) 20 MG tablet, Take 20 mg by mouth daily., Disp: , Rfl:  .  lisinopril (PRINIVIL,ZESTRIL) 2.5 MG tablet, Take 2.5 mg by mouth daily., Disp: , Rfl:  .  lisinopril (ZESTRIL) 5 MG tablet, Take 5 mg by mouth daily., Disp: , Rfl:  .  LORazepam (ATIVAN) 0.5 MG tablet, , Disp: , Rfl:  .  metFORMIN (GLUCOPHAGE-XR) 500 MG 24 hr tablet, TAKE 4 TABLETS BY MOUTH DAILY WITH DINNER, Disp: 360 tablet, Rfl: 3 .  Multiple Vitamin (MULTIVITAMIN) tablet, Take 1 tablet by mouth daily., Disp: , Rfl:  .  NON FORMULARY, , Disp: , Rfl:  .  pantoprazole (PROTONIX) 40 MG tablet, TAKE 1 TABLET BY MOUTH TWICE DAILY BEFORE A MEAL, Disp: 60 tablet, Rfl: 6 .  Semaglutide,0.25 or 0.5MG/DOS, (OZEMPIC, 0.25 OR 0.5 MG/DOSE,) 2 MG/1.5ML SOPN, Inject 0.5 mg into the skin once a week., Disp: 2 pen, Rfl: 5 .  simvastatin (ZOCOR) 10 MG tablet, Take 10 mg by mouth at bedtime., Disp: , Rfl:  .  triamcinolone (NASACORT ALLERGY 24HR) 55 MCG/ACT AERO nasal inhaler, Use one spray in each nostril three to seven times weekly as directed., Disp: 1 Inhaler, Rfl: 0   Review of Systems  Constitutional: Negative for activity change, appetite change, chills, diaphoresis, fatigue, fever and unexpected weight change.  HENT: Negative for  congestion, rhinorrhea, sinus pressure, sneezing, sore throat and trouble swallowing.   Eyes: Negative for photophobia and visual disturbance.  Respiratory: Negative for cough, chest tightness, shortness of breath, wheezing and stridor.   Cardiovascular: Negative for chest pain, palpitations and leg swelling.  Gastrointestinal: Negative for abdominal distention, abdominal pain, anal bleeding, blood in stool, constipation, diarrhea, nausea and vomiting.  Genitourinary: Negative for difficulty urinating, dysuria, flank pain and hematuria.  Musculoskeletal: Positive for arthralgias. Negative for back pain, gait problem, joint swelling and myalgias.  Skin: Negative for color change, pallor, rash and wound.  Neurological: Negative for dizziness, tremors, weakness and light-headedness.  Hematological: Negative for adenopathy. Does not bruise/bleed easily.  Psychiatric/Behavioral: Negative for agitation, behavioral problems, confusion, decreased concentration, dysphoric mood and sleep disturbance.       Objective:   Physical Exam Constitutional:      General: She is not in acute distress.    Appearance: Normal appearance. She is well-developed. She is not ill-appearing or diaphoretic.  HENT:     Head: Normocephalic and atraumatic.     Right Ear: Hearing and external ear normal.     Left Ear: Hearing and external ear normal.     Nose: No nasal deformity or rhinorrhea.  Eyes:     General: No scleral icterus.    Conjunctiva/sclera: Conjunctivae normal.     Right eye: Right conjunctiva is not injected.     Left eye: Left conjunctiva is not injected.     Pupils: Pupils are equal, round, and reactive to light.  Neck:     Vascular: No JVD.  Cardiovascular:     Rate and Rhythm: Normal rate and regular rhythm.     Heart sounds: Normal heart sounds, S1 normal and S2 normal.  Pulmonary:     Breath sounds: No wheezing.  Abdominal:     General: There is no distension.     Palpations: Abdomen is  soft.  Musculoskeletal:        General: Normal range of motion.     Right shoulder: Normal.     Left shoulder: Normal.     Cervical back: Normal range of motion and neck supple.     Right hip: Normal.     Left hip: Normal.     Right knee: Normal.     Left knee: Normal.  Lymphadenopathy:     Head:     Right side of head: No submandibular, preauricular or posterior auricular adenopathy.     Left side of head: No submandibular, preauricular or posterior auricular adenopathy.     Cervical: No cervical adenopathy.     Right cervical: No superficial or deep cervical adenopathy.    Left cervical: No superficial or deep cervical adenopathy.  Skin:    General: Skin is warm and dry.     Coloration: Skin is not jaundiced or pale.     Findings: No abrasion, bruising, ecchymosis, erythema, lesion or rash.     Nails: There is no clubbing.  Neurological:     General: No focal deficit present.     Mental Status: She is alert and oriented to person, place, and time.     Sensory: No sensory deficit.     Coordination: Coordination normal.     Gait: Gait normal.  Psychiatric:        Attention and Perception: She is attentive.        Mood and Affect: Mood normal.        Speech: Speech normal.        Behavior: Behavior normal. Behavior is cooperative.        Thought Content: Thought content normal.        Judgment: Judgment normal.           Assessment & Plan:   Latent tuberculosis: Screen for HIV if this is negative we will initiate isoniazid x9 months with 5 follow-up in 3 months we will check baseline liver function tests  Screening we will screen her for not just HIV but hepatitis C hep B in a as well.

## 2019-08-20 ENCOUNTER — Ambulatory Visit: Payer: 59 | Admitting: Internal Medicine

## 2019-08-21 LAB — COMPLETE METABOLIC PANEL WITH GFR
AG Ratio: 1.4 (calc) (ref 1.0–2.5)
ALT: 24 U/L (ref 6–29)
AST: 15 U/L (ref 10–35)
Albumin: 3.8 g/dL (ref 3.6–5.1)
Alkaline phosphatase (APISO): 92 U/L (ref 31–125)
BUN: 8 mg/dL (ref 7–25)
CO2: 27 mmol/L (ref 20–32)
Calcium: 9.1 mg/dL (ref 8.6–10.2)
Chloride: 100 mmol/L (ref 98–110)
Creat: 0.74 mg/dL (ref 0.50–1.10)
GFR, Est African American: 112 mL/min/{1.73_m2} (ref >=60)
GFR, Est Non African American: 96 mL/min/{1.73_m2} (ref >=60)
Globulin: 2.7 g/dL (calc) (ref 1.9–3.7)
Glucose, Bld: 304 mg/dL — ABNORMAL HIGH (ref 65–99)
Potassium: 4.5 mmol/L (ref 3.5–5.3)
Sodium: 135 mmol/L (ref 135–146)
Total Bilirubin: 0.3 mg/dL (ref 0.2–1.2)
Total Protein: 6.5 g/dL (ref 6.1–8.1)

## 2019-08-21 LAB — HEPATITIS A ANTIBODY, TOTAL: Hepatitis A AB,Total: NONREACTIVE

## 2019-08-21 LAB — HEPATITIS B SURFACE ANTIGEN: Hepatitis B Surface Ag: NONREACTIVE

## 2019-08-21 LAB — HEPATITIS B SURFACE ANTIBODY,QUALITATIVE: Hep B S Ab: BORDERLINE — AB

## 2019-08-21 LAB — HEPATITIS C ANTIBODY
Hepatitis C Ab: NONREACTIVE
SIGNAL TO CUT-OFF: 0.01 (ref <1.00)

## 2019-08-21 LAB — HIV ANTIBODY (ROUTINE TESTING W REFLEX): HIV 1&2 Ab, 4th Generation: NONREACTIVE

## 2019-08-28 DIAGNOSIS — M2242 Chondromalacia patellae, left knee: Secondary | ICD-10-CM | POA: Diagnosis not present

## 2019-08-28 DIAGNOSIS — S83242A Other tear of medial meniscus, current injury, left knee, initial encounter: Secondary | ICD-10-CM | POA: Diagnosis not present

## 2019-08-30 ENCOUNTER — Other Ambulatory Visit: Payer: Self-pay | Admitting: Infectious Disease

## 2019-08-30 ENCOUNTER — Telehealth: Payer: Self-pay

## 2019-08-30 MED ORDER — ISONIAZID 300 MG PO TABS: 300.0000 mg | ORAL_TABLET | Freq: Every day | ORAL | 8 refills | Status: DC

## 2019-08-30 MED FILL — ISONIAZID 300 MG TABLET: 300 | 30 days supply | Qty: 30 | Fill #0

## 2019-08-30 NOTE — Telephone Encounter (Signed)
No medications have been currently sent to [facility] but can assist in any way once they are processed at the pharmacy or call the patient for an update.

## 2019-08-30 NOTE — Telephone Encounter (Signed)
No need to apologize. No problem! Thank you for doing that. I'll let the patient know.  Necha Harries Lorita Officer, RN

## 2019-08-30 NOTE — Telephone Encounter (Signed)
Patient sent MyChart message to provider following up on labs/medication prescription. Per chart, office was waiting for lab work to result before filling medication. Will forward to provider for prescription/updates.  Merril Nagy Lorita Officer, RN

## 2019-08-30 NOTE — Telephone Encounter (Signed)
I am so VERY sorry  I think I was covering my inbox and 3 others when this happened. I have sent in script to [facility]

## 2019-09-05 MED FILL — OZEMPIC 0.25 OR 0.5 MG/DOSE: 2 | 28 days supply | Qty: 2 | Fill #4

## 2019-09-23 DIAGNOSIS — H524 Presbyopia: Secondary | ICD-10-CM | POA: Diagnosis not present

## 2019-09-23 DIAGNOSIS — H52223 Regular astigmatism, bilateral: Secondary | ICD-10-CM | POA: Diagnosis not present

## 2019-09-25 DIAGNOSIS — S83242D Other tear of medial meniscus, current injury, left knee, subsequent encounter: Secondary | ICD-10-CM | POA: Diagnosis not present

## 2019-10-01 ENCOUNTER — Other Ambulatory Visit: Payer: Self-pay

## 2019-10-01 ENCOUNTER — Encounter: Payer: Self-pay | Admitting: Internal Medicine

## 2019-10-01 ENCOUNTER — Ambulatory Visit (INDEPENDENT_AMBULATORY_CARE_PROVIDER_SITE_OTHER): Payer: 59 | Admitting: Internal Medicine

## 2019-10-01 VITALS — BP 110/76 | HR 74 | Ht 62.5 in | Wt 218.5 lb

## 2019-10-01 DIAGNOSIS — E781 Pure hyperglyceridemia: Secondary | ICD-10-CM

## 2019-10-01 DIAGNOSIS — E669 Obesity, unspecified: Secondary | ICD-10-CM | POA: Diagnosis not present

## 2019-10-01 DIAGNOSIS — E1165 Type 2 diabetes mellitus with hyperglycemia: Secondary | ICD-10-CM

## 2019-10-01 LAB — POCT GLYCOSYLATED HEMOGLOBIN (HGB A1C): Hemoglobin A1C: 8.8 % — AB (ref 4.0–5.6)

## 2019-10-01 MED ORDER — FREESTYLE LIBRE 14 DAY READER DEVI: 1.0000 | Freq: Once | 0 refills | Status: AC

## 2019-10-01 MED ORDER — FREESTYLE LIBRE 14 DAY SENSOR MISC: 1.0000 | 11 refills | Status: AC

## 2019-10-01 NOTE — Progress Notes (Signed)
Patient ID: Morgan Drake, female   DOB: 1971/01/23, 49 y.o.   MRN: [id number]  This visit occurred during the SARS-CoV-2 public health emergency.  Safety protocols were in place, including screening questions prior to the visit, additional usage of staff PPE, and extensive cleaning of exam room while observing appropriate contact time as indicated for disinfecting solutions.   HPI: Morgan Drake is a 49 y.o.-year-old female, initially referred by Dr. [name] ([place], [place]), returning for f/u for DM2, dx as GDM in 1993 and 1998 and DM2 in 1999, non-insulin-dependent, uncontrolled, without long-term complications. Last visit 7.5 months ago.  Her grandson was recently sick and she had some more stress but he is better in the last 2 months and she is feeling better and her sugars improved.   Reviewed HbA1c levels: 07/11/2019: HbA1c 8.7% Lab Results  Component Value Date   HGBA1C 8.8 (A) 10/01/2019   HGBA1C 8.8 (A) 02/18/2019   HGBA1C 8.9 (A) 07/02/2018  07/02/2018: HbA1c calculated from fructosamine is higher than before, at 7.8% 02/26/2018: HbA1c calculated from fructosamine is much better than the measured one, at 7.4%. 10/16/2017: HbA1c calculated from fructosamine is much lower than the measured one: 7.2%! 07/17/2017: HbA1c calculated from fructosamine is much better: 7.6%! 05/29/2017: HbA1c 10.5% 09/07/2015: 7.1%  Pt was on a regimen of: - Glyxambi 25-5 mg at bedtime (!) - Trulicity 1.5 mg weekly  Currently on:  - Metformin ER 1500-2000 mg with dinner >> [phone] mg with dinner 02/2018 >> occasional diarrhea -may skip few doses - Glipizide 5 mg 2x a day before b'fast and dinner - she came off of this before last visit and we did not restart it then  - Ozempic 0.5 mg weekly She stopped Invokana 100 mg in am, before b'fast >> stopped b/c repeated yeast infections. We had to stop Trulicity b/c nausea/vomiting. HbA1c increased afterwards She was on reg.  Metformin >> tolerated it well for years, then started to have IBS - like sxs and had to stop. She was on Actos, Avandia. She refused insulin in the past.  Pt checks her sugars 1-2 times a day -much better in the last month: - am: 158-160s, 226 >> 105-130 >> 155-190 >> 150-210 >> 130-150 - 2h after b'fast: n/c >> 300s after creamer >> n/c - before lunch:  85, 110-114 >> 99-140 >> n/c - 2h after lunch: n/c - before dinner: 115-120s >> 90s >> n/c - 1-2h after dinner: 150-160s >> <180 >> 200-210 >> 180-220 >> 140-150s - bedtime: 110-108 >> n/c - nighttime: n/c Lowest sugar was  90s >> 150 >> 90; she has hypoglycemia awareness in the 90s. Highest sugar was 210 >> 220 >> 202.  Glucometer: One Touch Ultra  Pt's meals are: - Breakfast: 1/2 biscuit + lettuce, tomatoes, bacon or bacon or sausage - Lunch: salad + meat  - Dinner: salad + meat + beans or baked potatoes - Snacks: 1: cheese nips or fruit  -No CKD, last BUN/creatinine:  Lab Results  Component Value Date   BUN 8 08/19/2019   BUN 10 07/02/2018   CREATININE 0.74 08/19/2019   CREATININE 0.69 07/02/2018  05/29/2017:CMP normal, except glucose 102.  BUN/creatinine: 10/0.8 On lisinopril.  -+ Hypertriglyceridemia and hypercholesterolemia; last set of lipids:  Lab Results  Component Value Date   CHOL 283 (H) 07/02/2018   HDL 49.40 07/02/2018   LDLDIRECT 112.0 07/02/2018   TRIG 514.0 (H) 07/02/2018   CHOLHDL 6 07/02/2018  05/29/2017: 244/424/47/n/c 09/07/2015: 203/120/74/105 04/2015:  216/169/59/116  On Zocor 10 mg daily . She did not tolerate Vascepa.  - last eye exam was 09/23/2019: No DR, + cataracts - reportedly.  -She denies numbness and tingling in her feet.  She had this in the past but it improved after stopping breath.   2 daughters have gluten sensitivity and 1 daughter with Chrohn ds.  She also has a history of fatty liver, IBS with both constipation and diarrhea, anxiety/depression, GERD, PCOS (she had to have  fertility treatment when she got pregnant both times).  She is seeing Dr. Gavin Pound [place] [place], [place]) with rheumatology for inflammatory arthritis. She was dx'ed with RA >> on MTX. Pain a little better.   She had anaphylaxis after NSAIDs in the past.  ROS: Constitutional: no weight gain/+ weight loss, no fatigue, no subjective hyperthermia, no subjective hypothermia Eyes: no blurry vision, no xerophthalmia ENT: no sore throat, no nodules palpated in neck, no dysphagia, no odynophagia, no hoarseness Cardiovascular: no CP/no SOB/no palpitations/no leg swelling Respiratory: no cough/no SOB/no wheezing Gastrointestinal: no N/no V/no D/no C/no acid reflux Musculoskeletal: no muscle aches/no joint aches Skin: no rashes, no hair loss Neurological: no tremors/no numbness/no tingling/no dizziness  I reviewed pt's medications, allergies, PMH, social hx, family hx, and changes were documented in the history of present illness. Otherwise, unchanged from my initial visit note.  Past Medical History:  Diagnosis Date  . Acid reflux   . Atopic dermatitis, mild   . Bell's palsy   . Diabetes mellitus without complication ([place])    type 2  . Diabetic peripheral neuropathy ([place])   . GI bleed   . Hiatal hernia   . Hyperlipidemia   . Hypertension   . Hypertriglyceridemia    > 1000  . Metatarsal fracture   . Obesity   . PCOS (polycystic ovarian syndrome)   . Pericarditis 2008   Hospitalized at [facility] overnight, told she had an MI, no cath. Thinks they said pericarditis  . TB lung, latent 08/19/2019  . Tonsil stone    Past Surgical History:  Procedure Laterality Date  . ABDOMINAL HYSTERECTOMY    . CESAREAN SECTION    . CHOLECYSTECTOMY     Social History   Social History  . Marital Status: Single    Spouse Name: N/A  . Number of Children: 2   Occupational History  . RN  [employer]   Social History Main Topics  . Smoking status: Former Smoker -- 1.00 packs/day for 22 years    Types:  Cigarettes  . Smokeless tobacco: Never Used     Comment: quit 2016  . Alcohol Use: 0.0 oz/week    0 Standard drinks or equivalent per week     Comment: rare  . Drug Use: No   Current Outpatient Medications on File Prior to Visit  Medication Sig Dispense Refill  . Blood Glucose Monitoring Suppl (FREESTYLE LITE) DEVI Use to check blood sugar once a day. 1 kit 0  . EPINEPHrine 0.3 mg/0.3 mL IJ SOAJ injection INJ THE CONTENT OF 1 PEN UTD  0  . folic acid (FOLVITE) 1 MG tablet Take 3 mg by mouth daily.    . furosemide (LASIX) 20 MG tablet Take 20 mg by mouth daily as needed.    Marland Kitchen glipiZIDE (GLUCOTROL) 5 MG tablet TAKE 1 TABLET BY MOUTH TWICE DAILY 180 tablet 3  . glucose blood (FREESTYLE LITE) test strip Use to check blood sugar once a day. 100 each 12  . HYDROcodone-acetaminophen (NORCO/VICODIN)  5-325 MG tablet     . isoniazid (NYDRAZID) 300 MG tablet Take 1 tablet (300 mg total) by mouth daily. 30 tablet 8  . Lancets (FREESTYLE) lancets Use to check blood sugar once a day 100 each 12  . lisinopril (ZESTRIL) 5 MG tablet Take 5 mg by mouth daily.    Marland Kitchen LORazepam (ATIVAN) 0.5 MG tablet Take 0.5 mg by mouth as needed.     . metFORMIN (GLUCOPHAGE-XR) 500 MG 24 hr tablet TAKE 4 TABLETS BY MOUTH DAILY WITH DINNER 360 tablet 3  . Multiple Vitamin (MULTIVITAMIN) tablet Take 1 tablet by mouth daily.    . NON FORMULARY     . pantoprazole (PROTONIX) 40 MG tablet TAKE 1 TABLET BY MOUTH TWICE DAILY BEFORE A MEAL 60 tablet 6  . Semaglutide,0.25 or 0.5MG/DOS, (OZEMPIC, 0.25 OR 0.5 MG/DOSE,) 2 MG/1.5ML SOPN Inject 0.5 mg into the skin once a week. 2 pen 5  . simvastatin (ZOCOR) 10 MG tablet Take 10 mg by mouth at bedtime.    . triamcinolone (NASACORT ALLERGY 24HR) 55 MCG/ACT AERO nasal inhaler Use one spray in each nostril three to seven times weekly as directed. 1 Inhaler 0  . traMADol (ULTRAM) 50 MG tablet tramadol 50 mg tablet     No current facility-administered medications on file prior to visit.    Allergies  Allergen Reactions  . Nsaids Anaphylaxis  . Magnesium Citrate Nausea And Vomiting  . Peppermint Oil Swelling  . Cinnamon Other (See Comments)    Blisters in mouth  . Eggs Or Egg-Derived Products     sensitivity   . Flu Virus Vaccine   . Other Swelling    mint  . Pineapple Other (See Comments)    Tongue feels like furry spikes    Family History  Problem Relation Age of Onset  . Lung cancer Father   . Hypertension Father   . Hyperlipidemia Father   . CAD Father   . Heart attack Father   . Diabetes Father   . Mesothelioma Father        Asbestos exposure  . COPD Father   . COPD Other   . Heart attack Brother   . Graves' disease Brother   . Diabetes Brother   . Hyperlipidemia Brother   . Depression Mother   . Heart disease Mother   . Alcoholism Mother   . Hyperlipidemia Mother   . Congestive Heart Failure Mother   . CAD Mother   . Anemia Mother   . GER disease Mother   . Rheum arthritis Mother   . Heart attack Mother   . Ovarian cancer Mother   . COPD Mother   . Diabetes Mother   . Graves' disease Mother   . Irritable bowel syndrome Mother   . Dementia Mother   . CAD Maternal Grandmother   . Congestive Heart Failure Maternal Grandmother   . Diabetes Maternal Grandmother   . Heart attack Maternal Grandmother   . Breast cancer Maternal Grandmother        65-67  . Hyperlipidemia Maternal Grandmother   . Diabetes Paternal Grandmother   . Thyroid cancer Paternal Grandmother   . Esophageal cancer Maternal Grandfather   . Barrett's esophagus Maternal Grandfather   . Heart disease Maternal Grandfather   . Congestive Heart Failure Maternal Grandfather   . COPD Maternal Grandfather   . Hashimoto's thyroiditis Sister   . Diabetes Sister   . Rheum arthritis Sister   . Hyperlipidemia Sister   . Diabetes Paternal  Grandfather   . Heart disease Paternal Grandfather   . Hyperlipidemia Paternal Grandfather   . Diabetes Sister   . Hyperlipidemia Sister   .  Hypothyroidism Sister    PE: BP 110/76 (BP Location: Left Arm, Patient Position: Sitting, Cuff Size: Large)   Pulse 74   Ht 5' 2.5" (1.588 m)   Wt 218 lb 8 oz (99.1 kg)   SpO2 97%   BMI 39.33 kg/m  Wt Readings from Last 3 Encounters:  10/01/19 218 lb 8 oz (99.1 kg)  08/19/19 217 lb (98.4 kg)  07/12/19 218 lb (98.9 kg)   Constitutional: overweight, in NAD Eyes: PERRLA, EOMI, no exophthalmos ENT: moist mucous membranes, no thyromegaly, no cervical lymphadenopathy Cardiovascular: RRR, No MRG, B LE up to knees Respiratory: CTA B Gastrointestinal: abdomen soft, NT, ND, BS+ Musculoskeletal: no deformities, strength intact in all 4 Skin: moist, warm, no rashes Neurological: no tremor with outstretched hands, DTR normal in all 4  ASSESSMENT: 1. DM2, non-insulin-dependent, uncontrolled, without long term complications, but with hyperglycemia - we stopped Trulicity b/c N/V, Invokana b/c Yeast vaginitis  2. HTG  3. Obesity class 3  4.  Family history of thyroid disease -Graves' disease in mother and brother -Hypothyroidism in sister  PLAN:  1. Patient with longstanding, uncontrolled, type 2 diabetes, worsened after her anaphylaxis episode to NSAIDs.  In 2019 she also had a period of time in which she was homeless and missing many medications.  However, afterwards, sugars did improve but remained above target.  Before last visit, she came off [place].  She was tolerating it well.  She has a history of yeast infections but with Invokana, not [place].  At last visit we started Ozempic.  She had not been vomiting the Trulicity but she tolerates Ozempic well. -At this visit, sugars have improved in the last month, especially after dinner.  Sugars in the morning are also much closer to goal than before.  He describes that she had a lot of stress with her grandson being sick and the situation only improved in the last 2 months.  Her sugars decreased in this period of time.  Therefore, for now,  we will continue the current regimen.  At next visit, if sugars are not still above target, we can increase Ozempic dose or add back [place]. - I suggested to:  Patient Instructions  Please continue: - Metformin ER [phone] mg with dinner. - Glipizide 5 mg 2x a day before breakfast and dinner - Ozempic 0.5 mg weekly  Please stop at the lab.  Please return in 3-4 months with your sugar log.   - we checked her HbA1c: 8.8% (stable-but higher than expected from her blood sugars at home >> we will also check a fructosamine level) - advised to check sugars at different times of the day - 1x a day, rotating check times - advised for yearly eye exams >> she is UTD - return to clinic in 3-4 months   2. HTG - Reviewed latest lipid panel from 06/2018: Triglycerides high, in the 500s Lab Results  Component Value Date   CHOL 283 (H) 07/02/2018   HDL 49.40 07/02/2018   LDLDIRECT 112.0 07/02/2018   TRIG 514.0 (H) 07/02/2018   CHOLHDL 6 07/02/2018  - Continues Zocor without side effects.  She ran out of flaxseed oil. - will check another lipid panel now (nonfasting)  3. Obesity class 2 -Continues to hike -At last visit, we added Ozempic, which should also help with weight loss -  lost 10 lbs since last OV  Component     Latest Ref Rng & Units 10/01/2019  Cholesterol     0 - 200 mg/dL 283 (H)  Triglycerides     0.0 - 149.0 mg/dL 826.0 Triglyceride is over 400; calculations on Lipids are invalid. (H)  HDL Cholesterol     >39.00 mg/dL 42.20  Total CHOL/HDL Ratio      7  Hemoglobin A1C     4.0 - 5.6 % 8.8 (A)  Fructosamine     205 - 285 umol/L 372 (H)  Direct LDL     mg/dL 107.0   HbA1c calculated from fructosamine is 7.9%, slightly better than the directly measured HbA1c. Triglycerides are very high.  I would suggest to add fenofibrate.  Philemon Kingdom, MD PhD [facility] Endocr[facility]

## 2019-10-01 NOTE — Patient Instructions (Signed)
Please continue: - Metformin ER [phone] mg with dinner. - Glipizide 5 mg 2x a day before breakfast and dinner - Ozempic 0.5 mg weekly  Please stop at the lab.  Please return in 3-4 months with your sugar log.

## 2019-10-02 LAB — LIPID PANEL
Cholesterol: 283 mg/dL — ABNORMAL HIGH (ref 0–200)
HDL: 42.2 mg/dL (ref >39.00)
Total CHOL/HDL Ratio: 7
Triglycerides: 826 mg/dL — ABNORMAL HIGH (ref 0.0–149.0)

## 2019-10-02 LAB — LDL CHOLESTEROL, DIRECT: Direct LDL: 107 mg/dL

## 2019-10-03 ENCOUNTER — Telehealth: Payer: Self-pay | Admitting: Internal Medicine

## 2019-10-03 LAB — FRUCTOSAMINE: Fructosamine: 372 umol/L — ABNORMAL HIGH (ref 205–285)

## 2019-10-03 NOTE — Telephone Encounter (Signed)
I have not received any notification that this needs a PA.

## 2019-10-03 NOTE — Telephone Encounter (Signed)
-   called received during Epic downtime -   Patient called to follow up on PA status for 24 hour Free Style   Call back number is [phone]

## 2019-10-04 MED FILL — ISONIAZID 300 MG TABLET: 300 | 30 days supply | Qty: 30 | Fill #1

## 2019-10-04 MED FILL — FREESTYLE LIBRE 14 DAY SENS: 13 days supply | Qty: 1 | Fill #0

## 2019-10-04 MED FILL — FUROSEMIDE 20 MG TABS: 20 | 90 days supply | Qty: 90 | Fill #2

## 2019-10-04 MED FILL — metFORMIN HCL ER 500 MG TB2: 500 | 30 days supply | Qty: 120 | Fill #5

## 2019-10-04 MED FILL — OZEMPIC 0.25 OR 0.5 MG/DOSE: 2 | 28 days supply | Qty: 2 | Fill #5

## 2019-10-04 MED FILL — LISINOPRIL 2.5 MG TABLET: 2.5 | 90 days supply | Qty: 90 | Fill #2

## 2019-10-04 MED FILL — PANTOPRAZOLE SOD DR 40 MG T: 40 | 30 days supply | Qty: 60 | Fill #3

## 2019-10-04 MED FILL — glipiZIDE 5 MG TABS: 5 | 90 days supply | Qty: 180 | Fill #3

## 2019-10-08 NOTE — Telephone Encounter (Signed)
Patient called stating her insurance has not received anything yet for the PA - patient has given a direct number we can call to speak to them about the [place] - [phone]

## 2019-10-08 NOTE — Telephone Encounter (Addendum)
We have not heard from her insurance. The number provided below was not the correct number to call to check on this. The correct number is [phone].  I have called the correct number and the Elenor Legato is denied. Insurance prefers [employer].  Elenor Legato has been denied.   1. Must try and fail the Dexcom CGM  2. Must be doing at least 4 finger glucose sticks per day 3. Must be on insulin pump or insulin injections.  While I had this person on the phone they went ahead and started a PA for the Dexcom ( needs one even though it is preferred) and was told it will take 1-3 business days for clinical review. I also asked if the same criteria must be met for the Dexcom to be approved and they said they did not know. It is unlikely though they will approved it if you have not been checking 4 times a day and on insulin but we can try.

## 2019-11-01 MED FILL — OZEMPIC 0.25 OR 0.5 MG/DOSE: 2 | 28 days supply | Qty: 2 | Fill #6

## 2019-11-12 MED FILL — PANTOPRAZOLE SOD DR 40 MG T: 40 | 30 days supply | Qty: 60 | Fill #4

## 2019-11-12 MED FILL — FREESTYLE LIBRE 14 DAY SENS: 25 days supply | Qty: 2 | Fill #2

## 2019-11-12 MED FILL — ISONIAZID 300 MG TABLET: 300 | 30 days supply | Qty: 30 | Fill #2

## 2019-11-18 ENCOUNTER — Ambulatory Visit: Payer: 59 | Admitting: Infectious Disease

## 2019-12-04 MED FILL — FREESTYLE LIBRE 14 DAY SENS: 25 days supply | Qty: 2 | Fill #3

## 2019-12-04 MED FILL — OZEMPIC 0.25 OR 0.5 MG/DOSE: 2 | 28 days supply | Qty: 2 | Fill #7

## 2019-12-18 ENCOUNTER — Other Ambulatory Visit: Payer: Self-pay

## 2019-12-18 ENCOUNTER — Encounter: Payer: Self-pay | Admitting: Infectious Disease

## 2019-12-18 ENCOUNTER — Telehealth (INDEPENDENT_AMBULATORY_CARE_PROVIDER_SITE_OTHER): Payer: 59 | Admitting: Infectious Disease

## 2019-12-18 DIAGNOSIS — Z227 Latent tuberculosis: Secondary | ICD-10-CM

## 2019-12-18 DIAGNOSIS — M069 Rheumatoid arthritis, unspecified: Secondary | ICD-10-CM

## 2019-12-18 DIAGNOSIS — E1165 Type 2 diabetes mellitus with hyperglycemia: Secondary | ICD-10-CM | POA: Diagnosis not present

## 2019-12-18 DIAGNOSIS — L80 Vitiligo: Secondary | ICD-10-CM | POA: Diagnosis not present

## 2019-12-18 DIAGNOSIS — M057A Rheumatoid arthritis with rheumatoid factor of other specified site without organ or systems involvement: Secondary | ICD-10-CM

## 2019-12-18 HISTORY — DX: Rheumatoid arthritis, unspecified: M06.9

## 2019-12-18 HISTORY — DX: Vitiligo: L80

## 2019-12-18 NOTE — Progress Notes (Signed)
Virtual Visit via Telephone Note  I connected with Morgan Drake on 12/18/19 at  3:45 PM EDT by telephone and verified that I am speaking with the correct person using two identifiers.  Location: Patient: Home Provider: [facility]   I discussed the limitations, risks, security and privacy concerns of performing an evaluation and management service by telephone and the availability of in person appointments. I also discussed with the patient that there may be a patient responsible charge related to this service. The patient expressed understanding and agreed to proceed.   History of Present Illness:  49 year old Caucasian female with a history of rheumatoid arthritis being treated by Dr. Gavin Pound with [facility] rheumatology.  She has tried various agents fluting Humira more recently leflunomide.  She recently had a QuantiFERON gold test done as part of routine testing while on immunosuppressive therapy.  This was positive she remembers having exposure to a patient with tuberculosis in 2006 without PPE because the patient was initially admitted without anyone having an anxiety or concerned that the patient might have TB.  She had always tested negative since then.  She had a dust x-ray performed at [facility] which was negative for any infiltrates.  She has no other symptoms to suggest deep TB infection.  We started her on isoniazid for treatment of her latent tuberculosis.  She has been off potent immunosuppressive drugs in part due to the fact that she had a flaring of her vitiligo.  She is also seeing Dr. Cruzita Lederer for management of diabetes mellitus which is been difficult recently.  She is employed here by [employer] and is supposed to have mandatory COVID-19 vaccination.  She attempted to go get the vaccine a few days ago at a local pharmacy but was refused when I saw her list of allergies.  She was sent to her primary care physician for recommendations with regards to the  vaccine and that physician was apparently against it.  In reviewing her allergies and vaccine history it is clear that she has been able to tolerate the egg free flu vaccine without problems.  She is not known to be allergic to polyethylene glycol and I do not see why she would be predicted to have an allergic reaction to COVID-19 mRNA vaccination.  My recommendation would be for her to have mRNA vaccination in a close monitored environment.  Observations/Objective:  She sounds to be doing well and tolerating her medications without problems  Assessment and Plan:  Latent TB: Complete treatment with INH  Rheumatoid arthritis holding immunosuppressive drugs  Diabetes mellitus following with endocrinology  Need for COVID-19 vaccination: She certainly does have a list of allergies but I do not see a basis to believe that she will have a specific allergic reaction to this vaccine based on its components.  I would certainly watch her very closely when she is vaccinated given the fact that she has shown the ability to have severe allergic reactions to various things.  Clearly the active vaccination itself is not an issue since she is able to tolerate egg free vaccines for flu.  Follow Up Instructions:    I discussed the assessment and treatment plan with the patient. The patient was provided an opportunity to ask questions and all were answered. The patient agreed with the plan and demonstrated an understanding of the instructions.   The patient was advised to call back or seek an in-person evaluation if the symptoms worsen or if the condition fails to improve as  anticipated.  I provided 21 minutes of non-face-to-face time during this encounter.   Alcide Evener, MD

## 2019-12-24 DIAGNOSIS — J320 Chronic maxillary sinusitis: Secondary | ICD-10-CM | POA: Diagnosis not present

## 2019-12-24 DIAGNOSIS — J309 Allergic rhinitis, unspecified: Secondary | ICD-10-CM | POA: Diagnosis not present

## 2019-12-24 DIAGNOSIS — Z6839 Body mass index (BMI) 39.0-39.9, adult: Secondary | ICD-10-CM | POA: Diagnosis not present

## 2020-01-08 MED FILL — ISONIAZID 300 MG TABLET: 300 | 30 days supply | Qty: 30 | Fill #3

## 2020-01-30 ENCOUNTER — Other Ambulatory Visit: Payer: Self-pay | Admitting: Internal Medicine

## 2020-01-30 MED FILL — metFORMIN HCL ER 500 MG TB2: 500 | 30 days supply | Qty: 120 | Fill #0

## 2020-01-30 MED FILL — glipiZIDE 5 MG TABS: 5 | 90 days supply | Qty: 180 | Fill #0

## 2020-01-30 MED FILL — LORazepam 0.5 MG TABS: 0.5 | 90 days supply | Qty: 90 | Fill #0

## 2020-01-30 MED FILL — OZEMPIC 0.25 OR 0.5 MG/DOSE: 2 | 28 days supply | Qty: 2 | Fill #8

## 2020-01-30 MED FILL — PANTOPRAZOLE SOD DR 40 MG T: 40 | 30 days supply | Qty: 60 | Fill #5

## 2020-01-30 MED FILL — FUROSEMIDE 20 MG TABS: 20 | 90 days supply | Qty: 90 | Fill #0

## 2020-01-30 MED FILL — LISINOPRIL 5 MG TABLET: 5 | 90 days supply | Qty: 90 | Fill #0

## 2020-01-30 NOTE — Telephone Encounter (Signed)
Patient called stating it is very important these are refilled today because today is her last day with [facility] and she does not know when her cobra will kick in. Just FYI

## 2020-02-01 MED FILL — ISONIAZID 300 MG TABLET: 300 | 30 days supply | Qty: 30 | Fill #4

## 2020-02-06 ENCOUNTER — Ambulatory Visit: Payer: 59 | Admitting: Internal Medicine

## 2020-02-06 NOTE — Progress Notes (Deleted)
Patient ID: Morgan Drake, female   DOB: 07/09/1970, 49 y.o.   MRN: [id number]  This visit occurred during the SARS-CoV-2 public health emergency.  Safety protocols were in place, including screening questions prior to the visit, additional usage of staff PPE, and extensive cleaning of exam room while observing appropriate contact time as indicated for disinfecting solutions.   HPI: Morgan Drake is a 49 y.o.-year-old female, initially referred by Dr. [name] ([place], [place]), returning for f/u for DM2, dx as GDM in 1993 and 1998 and DM2 in 1999, non-insulin-dependent, uncontrolled, without long-term complications. Last visit 4 months ago.  Since last visit, she had a positive INR and started INH for 9 months. She sees infectious disease.  Chest x-ray negative.  Reviewed HbA1c levels: 10/01/2019: HbA1c calculated from fructosamine 7.9%. Lab Results  Component Value Date   HGBA1C 8.8 (A) 10/01/2019   HGBA1C 8.8 (A) 02/18/2019   HGBA1C 8.9 (A) 07/02/2018  07/11/2019: HbA1c 8.7% 07/02/2018: HbA1c calculated from fructosamine is higher than before, at 7.8% 02/26/2018: HbA1c calculated from fructosamine is much better than the measured one, at 7.4%. 10/16/2017: HbA1c calculated from fructosamine is much lower than the measured one: 7.2%! 07/17/2017: HbA1c calculated from fructosamine is much better: 7.6%! 05/29/2017: HbA1c 10.5% 09/07/2015: 7.1%  Pt was on a regimen of: - Glyxambi 25-5 mg at bedtime (!) - Trulicity 1.5 mg weekly  Currently on:  - Metformin ER 1500-2000 mg with dinner >> [phone] mg with dinner 02/2018 >> occasional diarrhea -may skip few doses - Glipizide 5 mg 2x a day before b'fast and dinner - she came off this  fall 2020 - Ozempic 0.5 mg weekly She stopped Invokana 100 mg in am, before b'fast >> stopped b/c repeated yeast infections. We had to stop Trulicity b/c nausea/vomiting. HbA1c increased afterwards She was on reg. Metformin >>  tolerated it well for years, then started to have IBS - like sxs and had to stop. She was on Actos, Avandia. She refused insulin in the past.  Pt checks her sugars 1-2 times a day: - am: 155-190 >> 150-210 >> 130-150 - 2h after b'fast:  300s after creamer >> n/c - before lunch:  85, 110-114 >> 99-140 >> n/c - 2h after lunch: n/c - before dinner: 115-120s >> 90s >> n/c - 1-2h after dinner: 200-210 >> 180-220 >> 140-150s - bedtime: 110-108 >> n/c - nighttime: n/c Lowest sugar was  90s >> 150 >> 90; she has hypoglycemia awareness in the 90s. Highest sugar was 210 >> 220 >> 202.  Glucometer: One Touch Ultra  Pt's meals are: - Breakfast: 1/2 biscuit + lettuce, tomatoes, bacon or bacon or sausage - Lunch: salad + meat  - Dinner: salad + meat + beans or baked potatoes - Snacks: 1: cheese nips or fruit  -No CKD, last BUN/creatinine:  Lab Results  Component Value Date   BUN 8 08/19/2019   BUN 10 07/02/2018   CREATININE 0.74 08/19/2019   CREATININE 0.69 07/02/2018  05/29/2017:CMP normal, except glucose 102.  BUN/creatinine: 10/0.8 On lisinopril.  -+ Hypertriglyceridemia and hypercholesterolemia; last set of lipids: Lab Results  Component Value Date   CHOL 283 (H) 10/01/2019   HDL 42.20 10/01/2019   LDLDIRECT 107.0 10/01/2019   TRIG (H) 10/01/2019    826.0 Triglyceride is over 400; calculations on Lipids are invalid.   CHOLHDL 7 10/01/2019  05/29/2017: 244/424/47/n/c 09/07/2015: 203/120/74/105 04/2015: 216/169/59/116  On Zocor 10 .  She did not tolerate Vacsepa.  She is not  on a fibrate, as suggested at last visit.  - last eye exam was in 09/2019: No DR, + cataracts-reportedly.  - no numbness and tingling in her feet.  She had this in the past but improved after stopping bread.   2 daughters have gluten sensitivity and 1 daughter with Chrohn ds.  She also has a history of fatty liver, IBS with both constipation and diarrhea, anxiety/depression, GERD, PCOS (she had to have  fertility treatment when she got pregnant both times).  She is seeing Dr. Gavin Pound [place], [place]) with rheumatology for inflammatory arthritis. She was dx'ed with RA >> on MTX. Pain a little better.   She had anaphylaxis after NSAIDs in the past.  ROS: Constitutional: no weight gain/no weight loss, no fatigue, no subjective hyperthermia, no subjective hypothermia Eyes: no blurry vision, no xerophthalmia ENT: no sore throat, no nodules palpated in neck, no dysphagia, no odynophagia, no hoarseness Cardiovascular: no CP/no SOB/no palpitations/no leg swelling Respiratory: no cough/no SOB/no wheezing Gastrointestinal: no N/no V/no D/no C/no acid reflux Musculoskeletal: no muscle aches/no joint aches Skin: no rashes, no hair loss Neurological: no tremors/no numbness/no tingling/no dizziness  I reviewed pt's medications, allergies, PMH, social hx, family hx, and changes were documented in the history of present illness. Otherwise, unchanged from my initial visit note.  Past Medical History:  Diagnosis Date  . Acid reflux   . Atopic dermatitis, mild   . Bell's palsy   . Diabetes mellitus without complication ([place])    type 2  . Diabetic peripheral neuropathy ([place])   . GI bleed   . Hiatal hernia   . Hyperlipidemia   . Hypertension   . Hypertriglyceridemia    > 1000  . Metatarsal fracture   . Obesity   . PCOS (polycystic ovarian syndrome)   . Pericarditis 2008   Hospitalized at [facility] overnight, told she had an MI, no cath. Thinks they said pericarditis  . Rheumatoid arthritis ([place]) 12/18/2019  . TB lung, latent 08/19/2019  . Tonsil stone   . Vitiligo 12/18/2019   Past Surgical History:  Procedure Laterality Date  . ABDOMINAL HYSTERECTOMY    . CESAREAN SECTION    . CHOLECYSTECTOMY     Social History   Social History  . Marital Status: Single    Spouse Name: N/A  . Number of Children: 2   Occupational History  . RN  [employer]   Social History Main Topics  . Smoking  status: Former Smoker -- 1.00 packs/day for 22 years    Types: Cigarettes  . Smokeless tobacco: Never Used     Comment: quit 2016  . Alcohol Use: 0.0 oz/week    0 Standard drinks or equivalent per week     Comment: rare  . Drug Use: No   Current Outpatient Medications on File Prior to Visit  Medication Sig Dispense Refill  . Blood Glucose Monitoring Suppl (FREESTYLE LITE) DEVI Use to check blood sugar once a day. 1 kit 0  . Continuous Blood Gluc Sensor (FREESTYLE LIBRE 14 DAY SENSOR) MISC 1 each by Does not apply route every 14 (fourteen) days. Change every 2 weeks 2 each 11  . EPINEPHrine 0.3 mg/0.3 mL IJ SOAJ injection INJ THE CONTENT OF 1 PEN UTD  0  . folic acid (FOLVITE) 1 MG tablet Take 3 mg by mouth daily.    . furosemide (LASIX) 20 MG tablet Take 20 mg by mouth daily as needed.    Marland Kitchen glipiZIDE (GLUCOTROL) 5 MG tablet  TAKE 1 TABLET BY MOUTH TWICE DAILY 180 tablet 1  . glucose blood (FREESTYLE LITE) test strip Use to check blood sugar once a day. 100 each 12  . HYDROcodone-acetaminophen (NORCO/VICODIN) 5-325 MG tablet     . isoniazid (NYDRAZID) 300 MG tablet Take 1 tablet (300 mg total) by mouth daily. 30 tablet 8  . Lancets (FREESTYLE) lancets Use to check blood sugar once a day 100 each 12  . lisinopril (ZESTRIL) 5 MG tablet Take 5 mg by mouth daily.    Marland Kitchen LORazepam (ATIVAN) 0.5 MG tablet Take 0.5 mg by mouth as needed.     . metFORMIN (GLUCOPHAGE-XR) 500 MG 24 hr tablet TAKE 4 TABLETS BY MOUTH DAILY WITH DINNER 120 tablet 1  . Multiple Vitamin (MULTIVITAMIN) tablet Take 1 tablet by mouth daily.    . NON FORMULARY     . pantoprazole (PROTONIX) 40 MG tablet TAKE 1 TABLET BY MOUTH TWICE DAILY BEFORE A MEAL 60 tablet 6  . Semaglutide,0.25 or 0.5MG/DOS, (OZEMPIC, 0.25 OR 0.5 MG/DOSE,) 2 MG/1.5ML SOPN Inject 0.5 mg into the skin once a week. 2 pen 5  . simvastatin (ZOCOR) 10 MG tablet Take 10 mg by mouth at bedtime.    . traMADol (ULTRAM) 50 MG tablet tramadol 50 mg tablet    .  triamcinolone (NASACORT ALLERGY 24HR) 55 MCG/ACT AERO nasal inhaler Use one spray in each nostril three to seven times weekly as directed. 1 Inhaler 0   No current facility-administered medications on file prior to visit.   Allergies  Allergen Reactions  . Nsaids Anaphylaxis  . Magnesium Citrate Nausea And Vomiting  . Peppermint Oil Swelling  . Cinnamon Other (See Comments)    Blisters in mouth  . Eggs Or Egg-Derived Products     sensitivity   . Flu Virus Vaccine   . Other Swelling    mint  . Pineapple Other (See Comments)    Tongue feels like furry spikes    Family History  Problem Relation Age of Onset  . Lung cancer Father   . Hypertension Father   . Hyperlipidemia Father   . CAD Father   . Heart attack Father   . Diabetes Father   . Mesothelioma Father        Asbestos exposure  . COPD Father   . COPD Other   . Heart attack Brother   . Graves' disease Brother   . Diabetes Brother   . Hyperlipidemia Brother   . Depression Mother   . Heart disease Mother   . Alcoholism Mother   . Hyperlipidemia Mother   . Congestive Heart Failure Mother   . CAD Mother   . Anemia Mother   . GER disease Mother   . Rheum arthritis Mother   . Heart attack Mother   . Ovarian cancer Mother   . COPD Mother   . Diabetes Mother   . Graves' disease Mother   . Irritable bowel syndrome Mother   . Dementia Mother   . CAD Maternal Grandmother   . Congestive Heart Failure Maternal Grandmother   . Diabetes Maternal Grandmother   . Heart attack Maternal Grandmother   . Breast cancer Maternal Grandmother        65-67  . Hyperlipidemia Maternal Grandmother   . Diabetes Paternal Grandmother   . Thyroid cancer Paternal Grandmother   . Esophageal cancer Maternal Grandfather   . Barrett's esophagus Maternal Grandfather   . Heart disease Maternal Grandfather   . Congestive Heart Failure Maternal Grandfather   .  COPD Maternal Grandfather   . Hashimoto's thyroiditis Sister   . Diabetes Sister    . Rheum arthritis Sister   . Hyperlipidemia Sister   . Diabetes Paternal Grandfather   . Heart disease Paternal Grandfather   . Hyperlipidemia Paternal Grandfather   . Diabetes Sister   . Hyperlipidemia Sister   . Hypothyroidism Sister    PE: There were no vitals taken for this visit. Wt Readings from Last 3 Encounters:  10/01/19 218 lb 8 oz (99.1 kg)  08/19/19 217 lb (98.4 kg)  07/12/19 218 lb (98.9 kg)   Constitutional: overweight, in NAD Eyes: PERRLA, EOMI, no exophthalmos ENT: moist mucous membranes, no thyromegaly, no cervical lymphadenopathy Cardiovascular: RRR, No MRG Respiratory: CTA B Gastrointestinal: abdomen soft, NT, ND, BS+ Musculoskeletal: no deformities, strength intact in all 4 Skin: moist, warm, no rashes Neurological: no tremor with outstretched hands, DTR normal in all 4  ASSESSMENT: 1. DM2, non-insulin-dependent, uncontrolled, without long term complications, but with hyperglycemia - we stopped Trulicity b/c N/V, Invokana b/c Yeast vaginitis  2. HTG  3. Obesity class 3  4.  Family history of thyroid disease -Graves' disease in mother and brother -Hypothyroidism in sister  PLAN:  1. Patient with longstanding, uncontrolled, type 2 diabetes, worsened after her anaphylaxis episode to NSAIDs.  In 2019 she also had a period of time when she was homeless and missing many medication doses.  However, sugars did improve afterwards but remained above target. -In the past, she had yeast infections with Invokana.  She could not tolerate [place] well.  However, she came off at the end of last year.  Also, she had nausea and vomiting with Trulicity, but she tolerates Ozempic well.  We were able to continue this. -At last visit, sugars were better in the months prior to the appointment, especially after dinner.  Sugars in the morning were also  closer to goal compared to before.  At that time, HbA1c was slightly higher, at 8.8, however, an HbA1c calculated from  fructosamine was 7.9%, improved.  Therefore, we did not change her regimen at last visit but we discussed about potentially increasing Ozempic if sugars do not continue to improve or adding back [place].  - I suggested to:  Patient Instructions  Please continue: - Metformin ER [phone] mg with dinner. - Glipizide 5 mg 2x a day before breakfast and dinner - Ozempic 0.5 mg weekly  Please return in 3-4 months with your sugar log.   - we checked her HbA1c: 7%  - advised to check sugars at different times of the day - 1x a day, rotating check times - advised for yearly eye exams >> she is UTD - return to clinic in 3-4 months   2. HTG - Reviewed latest lipid panel from 06/2018: Triglycerides high, in the 500s Lab Results  Component Value Date   CHOL 283 (H) 10/01/2019   HDL 42.20 10/01/2019   LDLDIRECT 107.0 10/01/2019   TRIG (H) 10/01/2019    826.0 Triglyceride is over 400; calculations on Lipids are invalid.   CHOLHDL 7 10/01/2019  -Continue Zocor 10 and I also recommended fenofibrate, but she did not start this at last visit  3. Obesity class 2 -Continue Ozempic which should also help with weight loss -She lost 10 pounds before last visit  Philemon Kingdom, MD PhD [facility] End[facility]

## 2020-04-09 DIAGNOSIS — Z20828 Contact with and (suspected) exposure to other viral communicable diseases: Secondary | ICD-10-CM | POA: Diagnosis not present

## 2020-04-09 DIAGNOSIS — U071 COVID-19: Secondary | ICD-10-CM | POA: Diagnosis not present

## 2020-04-16 DIAGNOSIS — Z20828 Contact with and (suspected) exposure to other viral communicable diseases: Secondary | ICD-10-CM | POA: Diagnosis not present

## 2020-04-16 DIAGNOSIS — U071 COVID-19: Secondary | ICD-10-CM | POA: Diagnosis not present

## 2020-04-20 DIAGNOSIS — U071 COVID-19: Secondary | ICD-10-CM | POA: Diagnosis not present

## 2020-04-20 DIAGNOSIS — Z20828 Contact with and (suspected) exposure to other viral communicable diseases: Secondary | ICD-10-CM | POA: Diagnosis not present

## 2020-04-23 DIAGNOSIS — U071 COVID-19: Secondary | ICD-10-CM | POA: Diagnosis not present

## 2020-04-23 DIAGNOSIS — Z20828 Contact with and (suspected) exposure to other viral communicable diseases: Secondary | ICD-10-CM | POA: Diagnosis not present

## 2020-04-30 DIAGNOSIS — Z20828 Contact with and (suspected) exposure to other viral communicable diseases: Secondary | ICD-10-CM | POA: Diagnosis not present

## 2020-04-30 DIAGNOSIS — U071 COVID-19: Secondary | ICD-10-CM | POA: Diagnosis not present

## 2020-05-05 DIAGNOSIS — U071 COVID-19: Secondary | ICD-10-CM | POA: Diagnosis not present

## 2020-05-05 DIAGNOSIS — Z20828 Contact with and (suspected) exposure to other viral communicable diseases: Secondary | ICD-10-CM | POA: Diagnosis not present

## 2020-05-07 DIAGNOSIS — Z20828 Contact with and (suspected) exposure to other viral communicable diseases: Secondary | ICD-10-CM | POA: Diagnosis not present

## 2020-05-07 DIAGNOSIS — U071 COVID-19: Secondary | ICD-10-CM | POA: Diagnosis not present

## 2020-05-12 DIAGNOSIS — Z20828 Contact with and (suspected) exposure to other viral communicable diseases: Secondary | ICD-10-CM | POA: Diagnosis not present

## 2020-05-12 DIAGNOSIS — U071 COVID-19: Secondary | ICD-10-CM | POA: Diagnosis not present

## 2020-05-14 DIAGNOSIS — Z20828 Contact with and (suspected) exposure to other viral communicable diseases: Secondary | ICD-10-CM | POA: Diagnosis not present

## 2020-05-14 DIAGNOSIS — U071 COVID-19: Secondary | ICD-10-CM | POA: Diagnosis not present

## 2020-05-19 DIAGNOSIS — Z20828 Contact with and (suspected) exposure to other viral communicable diseases: Secondary | ICD-10-CM | POA: Diagnosis not present

## 2020-05-19 DIAGNOSIS — U071 COVID-19: Secondary | ICD-10-CM | POA: Diagnosis not present

## 2020-05-26 DIAGNOSIS — J069 Acute upper respiratory infection, unspecified: Secondary | ICD-10-CM | POA: Diagnosis not present

## 2020-06-03 DIAGNOSIS — R110 Nausea: Secondary | ICD-10-CM | POA: Diagnosis not present

## 2020-12-13 ENCOUNTER — Ambulatory Visit: Admit: 2020-12-13 | Discharge: 2020-12-14

## 2021-04-12 ENCOUNTER — Other Ambulatory Visit: Payer: Self-pay | Admitting: Family Medicine

## 2021-04-12 ENCOUNTER — Encounter: Payer: Self-pay | Admitting: Gastroenterology

## 2021-04-12 DIAGNOSIS — Z1231 Encounter for screening mammogram for malignant neoplasm of breast: Secondary | ICD-10-CM

## 2021-05-07 ENCOUNTER — Ambulatory Visit: Payer: Commercial Managed Care - PPO | Admitting: Gastroenterology

## 2021-05-07 ENCOUNTER — Encounter: Payer: Self-pay | Admitting: Gastroenterology

## 2021-05-07 ENCOUNTER — Ambulatory Visit (INDEPENDENT_AMBULATORY_CARE_PROVIDER_SITE_OTHER): Payer: Commercial Managed Care - PPO | Admitting: Gastroenterology

## 2021-05-07 VITALS — BP 122/78 | HR 76 | Ht 62.5 in | Wt 211.5 lb

## 2021-05-07 DIAGNOSIS — Z1211 Encounter for screening for malignant neoplasm of colon: Secondary | ICD-10-CM | POA: Diagnosis not present

## 2021-05-07 DIAGNOSIS — Z8371 Family history of colonic polyps: Secondary | ICD-10-CM

## 2021-05-07 NOTE — Progress Notes (Signed)
Chief Complaint: For colonoscopy  Referring Provider:  Ronita Hipps, MD      ASSESSMENT AND PLAN;   #1. Colorectal cancer screening  #2. IBS-C  #3. FH polyps (dad late 44s- polyps)  #4. GERD with HH  Plan: -Continue omeprazole 53m po QD -Colon with 2 day prep -Increase water intake. -Continue miralax prn as she has been doing   Discussed risks & benefits of colonoscopy. Risks including rare perforation req laparotomy, bleeding after bx/polypectomy req blood transfusion, rarely missing neoplasms, risks of anesthesia/sedation, rare risk of damage to internal organs. Benefits outweigh the risks. Patient agrees to proceed. All the questions were answered. Pt consents to proceed.  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  Here for colonoscopy for colorectal cancer screening  H/O IBS x over 20 yrs with predominant constipation. Constipation- 1 BM/day, hard, bloated, lower abdominal discomfort which gets better with defecation. Occ diarrhea with " blowout" Her symptoms of IBS are better since her stress level is much less now.  Reflux under good control with omeprazole 40 mg p.o. once a day.  Protonix worked better but unfortunately her insurance did not cover it.  No odynophagia or dysphagia.  No unintentional weight loss.  Denies having any melena or hematochezia.  No fever chills or night sweats.  Dad had polyps at late 443s  Past GI work-up: EGD 03/09/2015 -3 cm HH with healed distal esophageal erosions. Neg Px, mid, distal eso Bx for EoE/Barrett's -Mild gastritis. Neg CLO -Neg SB Bx for celiac  Colonoscopy 10/15/2008 ([facility])-small internal hemorrhoids.  Otherwise normal to TI.  Wt Readings from Last 3 Encounters:  05/07/21 211 lb 8 oz (95.9 kg)  10/01/19 218 lb 8 oz (99.1 kg)  08/19/19  217 lb (98.4 kg)    Past Medical History:  Diagnosis Date   Acid reflux    Anxiety    Atopic dermatitis, mild    Bell's palsy    Diabetes mellitus without complication ([facility])    type 2   Diabetic peripheral neuropathy ([facility])    GI bleed    Hemorrhoids    Hiatal hernia    History of peptic ulcer disease    Hyperlipidemia    Hypertension    Hypertriglyceridemia    > 1000   Kidney stone    Metatarsal fracture    Obesity    PCOS (polycystic ovarian syndrome)    Pericarditis 2008   Hospitalized at [facility] overnight, told she had an MI, no cath. Thinks they said pericarditis   Rheumatoid arthritis ([place] 12/18/2019   TB lung, latent 08/19/2019   Tonsil stone    Vitiligo 12/18/2019    Past Surgical History:  Procedure Laterality Date   ABDOMINAL HYSTERECTOMY     CESAREAN SECTION     CHOLECYSTECTOMY     COLONOSCOPY  10/15/2008   Small internal Hemorrhoids (mostly likely etiology of BRBPR) Otherwise normal colonoscopy to terminal ileum.   ESOPHAGOGASTRODUODENOSCOPY  03/09/2015   Hiatal hernia. Healed distal esophageal erosions. Mild gastritis   TUBAL LIGATION      Family History  Problem Relation Age of Onset   Depression Mother    Heart disease Mother    Alcoholism Mother    Hyperlipidemia Mother    Congestive Heart Failure Mother    CAD Mother    Anemia Mother  GER disease Mother    Rheum arthritis Mother    Heart attack Mother    Ovarian cancer Mother    COPD Mother    Diabetes Mother    Berenice Primas' disease Mother    Irritable bowel syndrome Mother    Dementia Mother    Lung cancer Father    Hypertension Father    Hyperlipidemia Father    CAD Father    Heart attack Father    Diabetes Father    Mesothelioma Father        Asbestos exposure   COPD Father    Hashimoto's thyroiditis Sister    Diabetes Sister    Rheum arthritis Sister    Hyperlipidemia Sister    Diabetes Sister    Hyperlipidemia Sister    Hypothyroidism Sister    Heart attack Brother     Graves' disease Brother    Diabetes Brother    Hyperlipidemia Brother    CAD Maternal Grandmother    Congestive Heart Failure Maternal Grandmother    Diabetes Maternal Grandmother    Heart attack Maternal Grandmother    Breast cancer Maternal Grandmother        65-67   Hyperlipidemia Maternal Grandmother    Esophageal cancer Maternal Grandfather    Barrett's esophagus Maternal Grandfather    Heart disease Maternal Grandfather    Congestive Heart Failure Maternal Grandfather    COPD Maternal Grandfather    Diabetes Paternal Grandmother    Thyroid cancer Paternal Grandmother    Diabetes Paternal Grandfather    Heart disease Paternal Grandfather    Hyperlipidemia Paternal Grandfather    COPD Other    Colon cancer Neg Hx    Rectal cancer Neg Hx    Stomach cancer Neg Hx     Social History   Tobacco Use   Smoking status: Former    Packs/day: 1.00    Years: 22.00    Pack years: 22.00    Types: Cigarettes   Smokeless tobacco: Never   Tobacco comments:    quit december 2015  Vaping Use   Vaping Use: Never used  Substance Use Topics   Alcohol use: Not Currently    Alcohol/week: 0.0 standard drinks   Drug use: Never    Current Outpatient Medications  Medication Sig Dispense Refill   Blood Glucose Monitoring Suppl (FREESTYLE LITE) DEVI Use to check blood sugar once a day. 1 kit 0   Continuous Blood Gluc Sensor (FREESTYLE LIBRE 14 DAY SENSOR) MISC 1 each by Does not apply route every 14 (fourteen) days. Change every 2 weeks 2 each 11   EPINEPHrine 0.3 mg/0.3 mL IJ SOAJ injection INJ THE CONTENT OF 1 PEN UTD  0   folic acid (FOLVITE) 1 MG tablet Take 3 mg by mouth daily.     furosemide (LASIX) 20 MG tablet Take 20 mg by mouth daily as needed.     glipiZIDE (GLUCOTROL) 5 MG tablet TAKE 1 TABLET BY MOUTH TWICE DAILY 180 tablet 1   glucose blood (FREESTYLE LITE) test strip Use to check blood sugar once a day. 100 each 12   HYDROcodone-acetaminophen (NORCO/VICODIN) 5-325 MG tablet       Lancets (FREESTYLE) lancets Use to check blood sugar once a day 100 each 12   lisinopril (ZESTRIL) 5 MG tablet Take 5 mg by mouth daily.     LORazepam (ATIVAN) 0.5 MG tablet Take 0.5 mg by mouth as needed.      methotrexate 50 MG/2ML injection Inject 15 mg into the skin  once a week.     Multiple Vitamin (MULTIVITAMIN) tablet Take 1 tablet by mouth daily.     NON FORMULARY      pantoprazole (PROTONIX) 40 MG tablet TAKE 1 TABLET BY MOUTH TWICE DAILY BEFORE A MEAL 60 tablet 6   simvastatin (ZOCOR) 10 MG tablet Take 10 mg by mouth at bedtime.     triamcinolone (NASACORT ALLERGY 24HR) 55 MCG/ACT AERO nasal inhaler Use one spray in each nostril three to seven times weekly as directed. 1 Inhaler 0   No current facility-administered medications for this visit.    Allergies  Allergen Reactions   Nsaids Anaphylaxis   Magnesium Citrate Nausea And Vomiting   Peppermint Oil Swelling   Cinnamon Other (See Comments)    Blisters in mouth   Eggs Or Egg-Derived Products     sensitivity    Influenza Virus Vaccine    Other Swelling    mint   Pineapple Other (See Comments)    Tongue feels like furry spikes     Review of Systems:  Constitutional: Denies fever, chills, diaphoresis, appetite change and has fatigue.  HEENT: Denies photophobia, eye pain, redness, hearing loss, ear pain, congestion, sore throat, rhinorrhea, sneezing, mouth sores, neck pain, neck stiffness and tinnitus.   Respiratory: Denies SOB, DOE, cough, chest tightness,  and wheezing.   Cardiovascular: Denies chest pain, palpitations and leg swelling.  Genitourinary: Denies dysuria, urgency, frequency, hematuria, flank pain and difficulty urinating.  Musculoskeletal: Has myalgias, back pain, joint swelling, arthralgias and gait problem.  Skin: No rash.  Neurological: Denies dizziness, seizures, syncope, weakness, light-headedness, numbness and headaches.  Hematological: Denies adenopathy. Easy bruising, personal or family  bleeding history  Psychiatric/Behavioral: Has anxiety or depression     Physical Exam:    BP 122/78    Pulse 76    Ht 5' 2.5" (1.588 m)    Wt 211 lb 8 oz (95.9 kg)    SpO2 98%    BMI 38.07 kg/m  Wt Readings from Last 3 Encounters:  05/07/21 211 lb 8 oz (95.9 kg)  10/01/19 218 lb 8 oz (99.1 kg)  08/19/19 217 lb (98.4 kg)   Constitutional:  Well-developed, in no acute distress. Psychiatric: Normal mood and affect. Behavior is normal. HEENT: Pupils normal.  Conjunctivae are normal. No scleral icterus. Cardiovascular: Normal rate, regular rhythm. No edema Pulmonary/chest: Effort normal and breath sounds normal. No wheezing, rales or rhonchi. Abdominal: Soft, nondistended. Nontender. Bowel sounds active throughout. There are no masses palpable. No hepatomegaly. Rectal: Deferred Neurological: Alert and oriented to person place and time. Skin: Skin is warm and dry. No rashes noted.  Data Reviewed: I have personally reviewed following labs and imaging studies  CBC: No flowsheet data found.  CMP: CMP Latest Ref Rng & Units 08/19/2019 07/02/2018 12/21/2015  Glucose 65 - 99 mg/dL 304(H) 257(H) 122(H)  BUN 7 - 25 mg/dL _0 Creatinine 0.50 - 1.10 mg/dL 0.74 0.69 0.70  Sodium 135 - 146 mmol/L 135 135 139  Potassium 3.5 - 5.3 mmol/L 4.5 4.1 4.5  Chloride 98 - 110 mmol/L 100 103 105  CO2 20 - 32 mmol/L _1 Calcium 8.6 - 10.2 mg/dL 9.1 8.8 8.9  Total Protein 6.1 - 8.1 g/dL 6.5 6.4 -  Total Bilirubin 0.2 - 1.2 mg/dL 0.3 0.5 -  AST 10 - 35 U/L 15 17 -  ALT 6 - 29 U/L 24 28 -        Carmell Austria, MD 05/07/2021, 9:35  AM  Cc: Ronita Hipps, MD

## 2021-05-07 NOTE — Patient Instructions (Addendum)
If you are age 49 or older, your body mass index should be between 23-30. Your Body mass index is 38.07 kg/m. If this is out of the aforementioned range listed, please consider follow up with your Primary Care Provider.  If you are age 24 or younger, your body mass index should be between 19-25. Your Body mass index is 38.07 kg/m. If this is out of the aformentioned range listed, please consider follow up with your Primary Care Provider.   ________________________________________________________  The [place] [facility] providers would like to encourage you to use [facility] to communicate with providers for non-urgent requests or questions.  Due to long hold times on the telephone, sending your provider a message by [facility] may be a faster and more efficient way to get a response.  Please allow 48 business hours for a response.  Please remember that this is for non-urgent requests.  _______________________________________________________  Two days before your procedure: Mix 3 packs (or capfuls) of Miralax in 48 ounces of clear liquid and drink at 6pm.  Continue omeprazole. Take Miralax as needed. Increase water intake.  You have been scheduled for a colonoscopy. Please follow written instructions given to you at your visit today.  Please pick up your prep supplies at the pharmacy within the next 1-3 days. If you use inhalers (even only as needed), please bring them with you on the day of your procedure.  We have given you samples of the following medication to take: Clenpiq  Thank you,  Dr. Jackquline Denmark

## 2021-05-11 ENCOUNTER — Ambulatory Visit (INDEPENDENT_AMBULATORY_CARE_PROVIDER_SITE_OTHER): Payer: Commercial Managed Care - PPO | Admitting: Internal Medicine

## 2021-05-11 ENCOUNTER — Other Ambulatory Visit: Payer: Self-pay | Admitting: Internal Medicine

## 2021-05-11 ENCOUNTER — Other Ambulatory Visit: Payer: Self-pay

## 2021-05-11 ENCOUNTER — Encounter: Payer: Self-pay | Admitting: Internal Medicine

## 2021-05-11 VITALS — BP 128/82 | HR 75 | Ht 62.5 in | Wt 209.4 lb

## 2021-05-11 DIAGNOSIS — E669 Obesity, unspecified: Secondary | ICD-10-CM

## 2021-05-11 DIAGNOSIS — E781 Pure hyperglyceridemia: Secondary | ICD-10-CM | POA: Diagnosis not present

## 2021-05-11 DIAGNOSIS — E1165 Type 2 diabetes mellitus with hyperglycemia: Secondary | ICD-10-CM | POA: Diagnosis not present

## 2021-05-11 LAB — POCT GLYCOSYLATED HEMOGLOBIN (HGB A1C): Hemoglobin A1C: 10.9 % — AB (ref 4.0–5.6)

## 2021-05-11 MED ORDER — ONETOUCH VERIO VI STRP: ORAL_STRIP | 3 refills | Status: AC

## 2021-05-11 MED ORDER — INSULIN PEN NEEDLE 32G X 4 MM MISC: 3 refills | Status: AC

## 2021-05-11 MED ORDER — TOUJEO SOLOSTAR 300 UNIT/ML ~~LOC~~ SOPN: 16.0000 [IU] | PEN_INJECTOR | Freq: Every day | SUBCUTANEOUS | 3 refills | Status: DC

## 2021-05-11 MED ORDER — ONETOUCH VERIO REFLECT W/DEVICE KIT: PACK | 0 refills | Status: DC

## 2021-05-11 MED ORDER — ONETOUCH DELICA PLUS LANCET33G MISC: 3 refills | Status: AC

## 2021-05-11 MED ORDER — TRESIBA FLEXTOUCH 200 UNIT/ML ~~LOC~~ SOPN: 16.0000 [IU] | PEN_INJECTOR | Freq: Every day | SUBCUTANEOUS | 3 refills | Status: DC

## 2021-05-11 NOTE — Patient Instructions (Addendum)
Please continue: - Glipizide 5 mg 2x a day before breakfast and dinner   Please start:  -Tresiba 12 units daily and increase by 2 units every 3 days until your sugars in am are <150 or you get to 32 units.  Please return in 1 month with your sugar log.

## 2021-05-11 NOTE — Progress Notes (Signed)
Patient ID: Morgan Drake, female   DOB: 1970-12-07, 51 y.o.   MRN: [id number]  This visit occurred during the SARS-CoV-2 public health emergency.  Safety protocols were in place, including screening questions prior to the visit, additional usage of staff PPE, and extensive cleaning of exam room while observing appropriate contact time as indicated for disinfecting solutions.   HPI: Morgan Drake is a 51 y.o.-year-old female, initially referred by Dr. [name] ([place], [place]), returning for f/u for DM2, dx as GDM in 1993 and 1998 and DM2 in 1999, non-insulin-dependent, uncontrolled, without long-term complications. Last visit 1 year and 7 months ago.  Previous visit 7.5 months ago. PCP: Dr. Helene Kelp - [place], [place].  Interim history: She returns after another long absence.  Since then, she sold her house and moved.  She had many family stressors and also lost her insurance for a while since last visit.  Initially this year she was losing weight and sugars were better, but then they started to worsen in 02/2021 when she had right foot septic arthritis. She has increased urination, blurry vision.  Also, she has anxiety with associated chest discomfort.  She had an EKG yesterday which was stable. She lost 9 pounds since last visit. She had COVID-19 in 12/2020 (3rd inf.) She was off all medicines b/c loss of insurance >> restarted Glipizide consistently in last 6 weeks.  Reviewed HbA1c levels: 10/01/2019: HbA1c calculated from fructosamine is 7.9%, slightly better than the directly measured HbA1c. Lab Results  Component Value Date   HGBA1C 8.8 (A) 10/01/2019   HGBA1C 8.8 (A) 02/18/2019   HGBA1C 8.9 (A) 07/02/2018  07/11/2019: HbA1c 8.7% 07/02/2018: HbA1c calculated from fructosamine is higher than before, at 7.8% 02/26/2018: HbA1c calculated from fructosamine is much better than the measured one, at 7.4%. 10/16/2017: HbA1c calculated from fructosamine is much lower  than the measured one: 7.2%! 07/17/2017: HbA1c calculated from fructosamine is much better: 7.6%!  Interim history 05/29/2017: HbA1c 10.5% 09/07/2015: 7.1%  Pt was on a regimen of: - Glyxambi 25-5 mg at bedtime (!) - Trulicity 1.5 mg weekly  Currently on:  - Metformin ER 1500-2000 mg with dinner >> [phone] mg with dinner 02/2018 >> occasional diarrhea -may skip few doses >> off now - Glipizide 5 mg 2x a day before b'fast and dinner - Ozempic 0.5 mg weekly >> off now She stopped Invokana 100 mg in am, before b'fast >> stopped b/c repeated yeast infections. Also previously on Farxiga 5 mg daily-came off in 2020. We had to stop Trulicity b/c nausea/vomiting. HbA1c increased afterwards She was on reg. Metformin >> tolerated it well for years, then started to have IBS - like sxs and had to stop. She was on Actos, Avandia. She refused insulin in the past.  Pt checks her sugars 1-2 times a day: - am:  155-190 >> 150-210 >> 130-150 >> 240-270 - 2h after b'fast: 300s after creamer >> n/c - before lunch:  85, 110-114 >> 99-140 >> n/c - 2h after lunch: n/c >> up to 300 - before dinner: 115-120s >> 90s >> n/c - 2h after dinner: 200-210 >> 180-220 >> 140-150s >> 300s - bedtime: 110-108 >> n/c - nighttime: n/c Lowest sugar was  150 >> 90 >> 200; she has hypoglycemia awareness in the 90s. Highest sugar was 220 >> 202 >> 419, 505.  Glucometer: One Touch Ultra  Pt's meals are: - Breakfast: 1/2 biscuit + lettuce, tomatoes, bacon or bacon or sausage - Lunch: salad +  meat  - Dinner: salad + meat + beans or baked potatoes - Snacks: 1: cheese nips or fruit  -No CKD, last BUN/creatinine:  04/12/2021: 9/0.62, GFR 109; glucose 415! - Per review of records in Care everywhere (rheumatology) Lab Results  Component Value Date   BUN 8 08/19/2019   BUN 10 07/02/2018   CREATININE 0.74 08/19/2019   CREATININE 0.69 07/02/2018  05/29/2017:CMP normal, except glucose 102.  BUN/creatinine: 10/0.8 On  lisinopril.  -+ Hypertriglyceridemia and hypercholesterolemia; last set of lipids: Lab Results  Component Value Date   CHOL 283 (H) 10/01/2019   HDL 42.20 10/01/2019   LDLDIRECT 107.0 10/01/2019   TRIG (H) 10/01/2019    826.0 Triglyceride is over 400; calculations on Lipids are invalid.   CHOLHDL 7 10/01/2019  05/29/2017: 244/424/47/n/c 09/07/2015: 203/120/74/105 04/2015: 216/169/59/116  On Crestor 40 mg daily << Zocor 10 mg daily . She did not tolerate Vascepa.  At last visit I recommended fenofibrate.  She did not start.  - last eye exam was 2022 reportedly: No DR, + cataracts - reportedly.  -She denies numbness and tingling in her feet.  She had this in the past but it improved after stopping bread.   She also has a history of fatty liver, IBS with both constipation and diarrhea, anxiety/depression, GERD, PCOS (she had to have fertility treatment when she got pregnant both times).   She is seeing Dr. Gavin Pound [facility] [place], [place]) with rheumatology for inflammatory arthritis (seronegative). She was dx'ed with RA >> on MTX.  She had anaphylaxis after NSAIDs in the past. 2 daughters have gluten sensitivity and 1 daughter with Chrohn ds.  ROS: + see HPI  I reviewed pt's medications, allergies, PMH, social hx, family hx, and changes were documented in the history of present illness. Otherwise, unchanged from my initial visit note.  Past Medical History:  Diagnosis Date   Acid reflux    Anxiety    Atopic dermatitis, mild    Bell's palsy    Diabetes mellitus without complication ([place])    type 2   Diabetic peripheral neuropathy ([facility])    GI bleed    Hemorrhoids    Hiatal hernia    History of peptic ulcer disease    Hyperlipidemia    Hypertension    Hypertriglyceridemia    > 1000   Kidney stone    Metatarsal fracture    Obesity    PCOS (polycystic ovarian syndrome)    Pericarditis 2008   Hospitalized at [facility] overnight, told she had an MI, no cath. Thinks they said  pericarditis   Rheumatoid arthritis ([place]) 12/18/2019   TB lung, latent 08/19/2019   Tonsil stone    Vitiligo 12/18/2019   Past Surgical History:  Procedure Laterality Date   ABDOMINAL HYSTERECTOMY     CESAREAN SECTION     CHOLECYSTECTOMY     COLONOSCOPY  10/15/2008   Small internal Hemorrhoids (mostly likely etiology of BRBPR) Otherwise normal colonoscopy to terminal ileum.   ESOPHAGOGASTRODUODENOSCOPY  03/09/2015   Hiatal hernia. Healed distal esophageal erosions. Mild gastritis   TUBAL LIGATION     Social History   Social History   Marital Status: Single    Spouse Name: N/A   Number of Children: 2   Occupational History   RN  [employer]   Social History Main Topics   Smoking status: Former Smoker -- 1.00 packs/day for 22 years    Types: Cigarettes   Smokeless tobacco: Never Used     Comment: quit  2016   Alcohol Use: 0.0 oz/week    0 Standard drinks or equivalent per week     Comment: rare   Drug Use: No   Current Outpatient Medications on File Prior to Visit  Medication Sig Dispense Refill   Blood Glucose Monitoring Suppl (FREESTYLE LITE) DEVI Use to check blood sugar once a day. 1 kit 0   Continuous Blood Gluc Sensor (FREESTYLE LIBRE 14 DAY SENSOR) MISC 1 each by Does not apply route every 14 (fourteen) days. Change every 2 weeks 2 each 11   EPINEPHrine 0.3 mg/0.3 mL IJ SOAJ injection INJ THE CONTENT OF 1 PEN UTD  0   folic acid (FOLVITE) 1 MG tablet Take 3 mg by mouth daily.     furosemide (LASIX) 20 MG tablet Take 20 mg by mouth daily as needed.     glipiZIDE (GLUCOTROL) 5 MG tablet TAKE 1 TABLET BY MOUTH TWICE DAILY 180 tablet 1   glucose blood (FREESTYLE LITE) test strip Use to check blood sugar once a day. 100 each 12   HYDROcodone-acetaminophen (NORCO/VICODIN) 5-325 MG tablet      Lancets (FREESTYLE) lancets Use to check blood sugar once a day 100 each 12   lisinopril (ZESTRIL) 5 MG tablet Take 5 mg by mouth daily.     LORazepam (ATIVAN) 0.5 MG tablet Take  0.5 mg by mouth as needed.      methotrexate 50 MG/2ML injection Inject 15 mg into the skin once a week.     Multiple Vitamin (MULTIVITAMIN) tablet Take 1 tablet by mouth daily.     NON FORMULARY      pantoprazole (PROTONIX) 40 MG tablet TAKE 1 TABLET BY MOUTH TWICE DAILY BEFORE A MEAL 60 tablet 6   simvastatin (ZOCOR) 10 MG tablet Take 10 mg by mouth at bedtime.     triamcinolone (NASACORT ALLERGY 24HR) 55 MCG/ACT AERO nasal inhaler Use one spray in each nostril three to seven times weekly as directed. 1 Inhaler 0   No current facility-administered medications on file prior to visit.   Allergies  Allergen Reactions   Nsaids Anaphylaxis   Magnesium Citrate Nausea And Vomiting   Peppermint Oil Swelling   Cinnamon Other (See Comments)    Blisters in mouth   Eggs Or Egg-Derived Products     sensitivity    Influenza Virus Vaccine    Other Swelling    mint   Pineapple Other (See Comments)    Tongue feels like furry spikes    Family History  Problem Relation Age of Onset   Depression Mother    Heart disease Mother    Alcoholism Mother    Hyperlipidemia Mother    Congestive Heart Failure Mother    CAD Mother    Anemia Mother    GER disease Mother    Rheum arthritis Mother    Heart attack Mother    Ovarian cancer Mother    COPD Mother    Diabetes Mother    Berenice Primas' disease Mother    Irritable bowel syndrome Mother    Dementia Mother    Lung cancer Father    Hypertension Father    Hyperlipidemia Father    CAD Father    Heart attack Father    Diabetes Father    Mesothelioma Father        Asbestos exposure   COPD Father    Hashimoto's thyroiditis Sister    Diabetes Sister    Rheum arthritis Sister    Hyperlipidemia Sister  Diabetes Sister    Hyperlipidemia Sister    Hypothyroidism Sister    Heart attack Brother    Berenice Primas' disease Brother    Diabetes Brother    Hyperlipidemia Brother    CAD Maternal Grandmother    Congestive Heart Failure Maternal Grandmother     Diabetes Maternal Grandmother    Heart attack Maternal Grandmother    Breast cancer Maternal Grandmother        65-67   Hyperlipidemia Maternal Grandmother    Esophageal cancer Maternal Grandfather    Barrett's esophagus Maternal Grandfather    Heart disease Maternal Grandfather    Congestive Heart Failure Maternal Grandfather    COPD Maternal Grandfather    Diabetes Paternal Grandmother    Thyroid cancer Paternal Grandmother    Diabetes Paternal Grandfather    Heart disease Paternal Grandfather    Hyperlipidemia Paternal Grandfather    COPD Other    Colon cancer Neg Hx    Rectal cancer Neg Hx    Stomach cancer Neg Hx    PE: BP 128/82 (BP Location: Right Arm, Patient Position: Sitting, Cuff Size: Normal)    Pulse 75    Ht 5' 2.5" (1.588 m)    Wt 209 lb 6.4 oz (95 kg)    SpO2 97%    BMI 37.69 kg/m  Wt Readings from Last 3 Encounters:  05/11/21 209 lb 6.4 oz (95 kg)  05/07/21 211 lb 8 oz (95.9 kg)  10/01/19 218 lb 8 oz (99.1 kg)   Constitutional: overweight, in NAD Eyes: PERRLA, EOMI, no exophthalmos ENT: moist mucous membranes, no thyromegaly, no cervical lymphadenopathy Cardiovascular: RRR, No MRG, B LE  Respiratory: CTA B Musculoskeletal: no deformities, strength intact in all 4 Skin: moist, warm, no rashes Neurological: no tremor with outstretched hands, DTR normal in all 4  ASSESSMENT: 1. DM2, non-insulin-dependent, uncontrolled, without long term complications, but with hyperglycemia - we stopped Trulicity b/c N/V, Invokana b/c Yeast vaginitis  2. HTG  3. Obesity class 3  4.  Family history of thyroid disease -Graves' disease in mother and brother -Hypothyroidism in sister  PLAN:  1. Patient with longstanding, uncontrolled, type 2 diabetes, worsened after her anaphylaxis episode to NSAIDs.  In 2019, she had a period of time in which she was homeless and missing many medications.  However, afterwards, sugars did improve but remained above target.  In 2020, she  came off [place] despite tolerating it well and not having any yeast infections, as she had with Invokana.  We started Ozempic.  She tolerated it well.  At last visit, sugars in the morning were improved and much closer to goal compared to before.  HbA1c was 7.9% as calculated from fructosamine.  We did not change the regimen at that time.  However, afterwards, she was lost for follow-up for a year and 7 months.  At this visit she tells me that she was off all of her medications due to being without insurance and only restarted glipizide 6 weeks ago. -At today's visit, her blood sugars are very high, in the 200s in the morning and up to 300s later in the day.  We discussed that it is a sign of glucotoxicity (she also has associated symptoms) and we cannot manage this without insulin.  She agrees to start this.  I suggested to start with a low dose of concentrated [place] and increase as needed.  I advised her to let me know when she gets to 32 units, in which case, we may need  to add mealtime insulin or possibly restart Ozempic, which she tolerated well.  She could not tolerate metformin well due to IBS. - I suggested to:  Patient Instructions  Please continue: - Glipizide 5 mg 2x a day before breakfast and dinner   Please start:  -Tresiba 12 units daily and increase by 2 units every 3 days until your sugars in am are <150 or you get to 32 units.  Please return in 1 month with your sugar log.   - we checked her HbA1c: 10.9% (higher) - advised to check sugars at different times of the day - 2x a day, rotating check times -sent a new prescription for the One Touch Verio reflect meter to her pharmacy - advised for yearly eye exams >> she is UTD - return to clinic in 1 months  2. HTG -Reviewed latest lipid panel from last visit: Triglycerides in the 800s, LDL also above target: Lab Results  Component Value Date   CHOL 283 (H) 10/01/2019   HDL 42.20 10/01/2019   LDLDIRECT 107.0 10/01/2019   TRIG  (H) 10/01/2019    826.0 Triglyceride is over 400; calculations on Lipids are invalid.   CHOLHDL 7 10/01/2019  -She continues the statin (Zocor 10 mg changed to Crestor 40 mg daily by PCP) without side effects.  At last visit she ran out flaxseed oil.  I recommended a fenofibrate but she did not start this. -She did have a more recent lipid panel by PCP.  She will send the records to me.  3. Obesity class 2 -Unfortunately, she came off Ozempic since last visit.  She does have insurance now ([employer] through her new workplace, [employer]) and we may be able to restart Ozempic in the near future if needed -She lost a net 9 pounds since last visit.  She mentions that she lost much more, but gained them back after her infectious arthritis episode 2 months ago.  - Total time spent for the visit: 40 minutes, in obtaining medical information from the patient and from chart, reviewing her  previous labs, imaging evaluations, and treatments, reviewing her symptoms, counseling her about her conditions (please see the discussed topics above), and developing a plan to further treat them.   Philemon Kingdom, MD PhD [facility] Endocrinology

## 2021-05-14 ENCOUNTER — Ambulatory Visit: Payer: 59 | Admitting: Internal Medicine

## 2021-05-15 MED ORDER — TOUJEO SOLOSTAR U-300 INSULIN 300 UNIT/ML (1.5 ML) SUBCUTANEOUS PEN
Freq: Every evening | SUBCUTANEOUS | 3 refills | 84 days
Start: 2021-05-15 — End: ?

## 2021-05-18 ENCOUNTER — Ambulatory Visit: Payer: 59

## 2021-06-11 ENCOUNTER — Encounter: Payer: Self-pay | Admitting: Internal Medicine

## 2021-06-11 ENCOUNTER — Ambulatory Visit: Payer: Commercial Managed Care - PPO | Admitting: Internal Medicine

## 2021-06-11 ENCOUNTER — Telehealth: Payer: Self-pay

## 2021-06-11 ENCOUNTER — Other Ambulatory Visit: Payer: Self-pay

## 2021-06-11 ENCOUNTER — Other Ambulatory Visit (HOSPITAL_COMMUNITY): Payer: Self-pay

## 2021-06-11 VITALS — BP 128/82 | HR 101 | Ht 62.5 in | Wt 214.2 lb

## 2021-06-11 DIAGNOSIS — E1165 Type 2 diabetes mellitus with hyperglycemia: Secondary | ICD-10-CM | POA: Diagnosis not present

## 2021-06-11 DIAGNOSIS — E781 Pure hyperglyceridemia: Secondary | ICD-10-CM

## 2021-06-11 DIAGNOSIS — E669 Obesity, unspecified: Secondary | ICD-10-CM

## 2021-06-11 MED ORDER — OZEMPIC (0.25 OR 0.5 MG/DOSE) 2 MG/1.5ML ~~LOC~~ SOPN: 0.5000 mg | PEN_INJECTOR | SUBCUTANEOUS | 5 refills | Status: DC

## 2021-06-11 NOTE — Patient Instructions (Addendum)
Please continue: - Glipizide 5 mg before b'fast and dinner  Increase: - Tresiba to 30 units daily  Please start: - Ozempic 0.25 mg weekly in a.m. (for example on Sunday morning) x 2-4 weeks, then increase to 0.5 mg weekly in a.m. if no nausea or hypoglycemia.  TRY TO REDUCE COFFEE to 1x a day in am.  Please return in 2 months with your sugar log.

## 2021-06-11 NOTE — Progress Notes (Signed)
Patient ID: Morgan Drake, female   DOB: Dec 18, 1970, 51 y.o.   MRN: [id number]  This visit occurred during the SARS-CoV-2 public health emergency.  Safety protocols were in place, including screening questions prior to the visit, additional usage of staff PPE, and extensive cleaning of exam room while observing appropriate contact time as indicated for disinfecting solutions.   HPI: Morgan Drake is a 51 y.o.-year-old female, initially referred by Dr. [name] ([place], [place]), returning for f/u for DM2, dx as GDM in 1993 and 1998 and DM2 in 1999, insulin-dependent, uncontrolled, without long-term complications.  Last visit 1 month ago.  Previous visit 1 year and 7 months ago.  Previous visit 7.5 months ago. PCP: Dr. Helene Kelp - [place], [place].  Interim history: No increased urination, blurry vision, nausea, chest pain. She just realized that coffee is raising her blood sugars.  She is drinking coffee throughout the day.  Reviewed HbA1c levels: Lab Results  Component Value Date   HGBA1C 10.9 (A) 05/11/2021   HGBA1C 8.8 (A) 10/01/2019   HGBA1C 8.8 (A) 02/18/2019  10/01/2019: HbA1c calculated from fructosamine is 7.9%, slightly better than the directly measured HbA1c. 07/11/2019: HbA1c 8.7% 07/02/2018: HbA1c calculated from fructosamine is higher than before, at 7.8% 02/26/2018: HbA1c calculated from fructosamine is much better than the measured one, at 7.4%. 10/16/2017: HbA1c calculated from fructosamine is much lower than the measured one: 7.2%! 07/17/2017: HbA1c calculated from fructosamine is much better: 7.6%!  Interim history 05/29/2017: HbA1c 10.5% 09/07/2015: 7.1%  Pt was on a regimen of: - Glyxambi 25-5 mg at bedtime (!) - Trulicity 1.5 mg weekly  Currently on:  - Tresiba 12 mg daily - added 05/2021 >> now 24 units - Glipizide 5 mg 2x a day before b'fast and dinner  Previously on metformin ER-occasional diarrhea. Previously on Ozempic, tolerated well,  but stopped being covered by insurance. She stopped Invokana 100 mg in am, before b'fast >> stopped b/c repeated yeast infections. Also previously on Farxiga 5 mg daily-came off in 2020. We had to stop Trulicity b/c nausea/vomiting. HbA1c increased afterwards She was on reg. Metformin >> tolerated it well for years, then started to have IBS - like sxs and had to stop. She was on Actos, Avandia. She refused insulin in the past.  Pt checks her sugars 1-2 times a day: - am:  150-210 >> 130-150 >> 240-270 >> 162-307, 404 (coffee) - 2h after b'fast: 300s after creamer >> n/c >> 224 - before lunch:  85, 110-114 >> 99-140 >> n/c >> 123 - 2h after lunch: n/c >> up to 300 >> 154-199, 340 - before dinner: 115-120s >> 90s >> n/c - 2h after dinner: 180-220 >> 140-150s >> 300s >> 198-268 - bedtime: 110-108 >> n/c - nighttime: n/c Lowest sugar was  150 >> 90 >> 200 >> 123; she has hypoglycemia awareness in the 90s. Highest sugar was 220 >> 202 >> 419, 505 >> 404  Glucometer: One Touch Ultra  Pt's meals are: - Breakfast: 1/2 biscuit + lettuce, tomatoes, bacon or bacon or sausage - Lunch: salad + meat  - Dinner: salad + meat + beans or baked potatoes - Snacks: 1: cheese nips or fruit  -No CKD, last BUN/creatinine:  04/12/2021: 9/0.62, GFR 109; glucose 415! - Per review of records in Care everywhere (rheumatology) Lab Results  Component Value Date   BUN 8 08/19/2019   BUN 10 07/02/2018   CREATININE 0.74 08/19/2019   CREATININE 0.69 07/02/2018  05/29/2017:CMP normal, except  glucose 102.  BUN/creatinine: 10/0.8 On lisinopril.  -+ Hypertriglyceridemia and hypercholesterolemia; last set of lipids: Lab Results  Component Value Date   CHOL 283 (H) 10/01/2019   HDL 42.20 10/01/2019   LDLDIRECT 107.0 10/01/2019   TRIG (H) 10/01/2019    826.0 Triglyceride is over 400; calculations on Lipids are invalid.   CHOLHDL 7 10/01/2019  05/29/2017: 244/424/47/n/c 09/07/2015: 203/120/74/105 04/2015:  216/169/59/116  On Crestor 40 mg daily << Zocor 10 mg daily . She did not tolerate Vascepa.  I previously recommended fenofibrate, but did not start yet.  - last eye exam was 2022 reportedly: No DR, + cataracts - reportedly.  -She denies numbness and tingling in her feet.  She had this in the past but it improved after stopping bread.   She also has a history of fatty liver, IBS with both constipation and diarrhea, anxiety/depression, GERD, PCOS (she had to have fertility treatment when she got pregnant both times).   She is seeing Dr. Gavin Pound [facility] [place], [place]) with rheumatology for inflammatory arthritis (seronegative). She was dx'ed with RA >> on MTX.  She had anaphylaxis after NSAIDs in the past. 2 daughters have gluten sensitivity and 1 daughter with Chrohn ds.  ROS: + see HPI  I reviewed pt's medications, allergies, PMH, social hx, family hx, and changes were documented in the history of present illness. Otherwise, unchanged from my initial visit note.  Past Medical History:  Diagnosis Date   Acid reflux    Anxiety    Atopic dermatitis, mild    Bell's palsy    Diabetes mellitus without complication ([place])    type 2   Diabetic peripheral neuropathy (HCC)    GI bleed    Hemorrhoids    Hiatal hernia    History of peptic ulcer disease    Hyperlipidemia    Hypertension    Hypertriglyceridemia    > 1000   Kidney stone    Metatarsal fracture    Obesity    PCOS (polycystic ovarian syndrome)    Pericarditis 2008   Hospitalized at [facility] overnight, told she had an MI, no cath. Thinks they said pericarditis   Rheumatoid arthritis ([place]) 12/18/2019   TB lung, latent 08/19/2019   Tonsil stone    Vitiligo 12/18/2019   Past Surgical History:  Procedure Laterality Date   ABDOMINAL HYSTERECTOMY     CESAREAN SECTION     CHOLECYSTECTOMY     COLONOSCOPY  10/15/2008   Small internal Hemorrhoids (mostly likely etiology of BRBPR) Otherwise normal colonoscopy to terminal ileum.    ESOPHAGOGASTRODUODENOSCOPY  03/09/2015   Hiatal hernia. Healed distal esophageal erosions. 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 Change every 2 weeks 2 each 11   EPINEPHrine 0.3 mg/0.3 mL IJ SOAJ injection INJ THE CONTENT OF 1 PEN UTD  0   folic acid (FOLVITE) 1 MG tablet Take 3 mg by mouth daily.     furosemide (LASIX) 20 MG tablet Take 20 mg by mouth daily as needed.     glipiZIDE (GLUCOTROL) 5 MG tablet TAKE 1 TABLET BY MOUTH TWICE DAILY 180 tablet 1   glucose blood (FREESTYLE LITE) test strip Use to check blood sugar once a day. 100 each 12   glucose blood (ONETOUCH VERIO) test strip Use as instructed 2x a day 200 each 3   HYDROcodone-acetaminophen (NORCO/VICODIN) 5-325 MG tablet      insulin glargine, 1 Unit Dial, (TOUJEO SOLOSTAR) 300 UNIT/ML Solostar Pen Inject 16 Units into the skin at bedtime. 4.5 mL 3   Insulin Pen Needle 32G X 4 MM MISC Use 1x a day 100 each 3   Lancets (ONETOUCH DELICA PLUS [id number]) MISC Check sugars 2x a day 200 each 3   lisinopril (ZESTRIL) 5 MG tablet Take 5 mg by mouth daily.     LORazepam (ATIVAN) 0.5 MG tablet Take 0.5 mg by mouth as needed.      methotrexate 50 MG/2ML injection Inject 15 mg into the skin once a week.     Multiple  Vitamin (MULTIVITAMIN) tablet Take 1 tablet by mouth daily.     NON FORMULARY      pantoprazole (PROTONIX) 40 MG tablet TAKE 1 TABLET BY MOUTH TWICE DAILY BEFORE A MEAL 60 tablet 6   simvastatin (ZOCOR) 10 MG tablet Take 10 mg by mouth at bedtime.     triamcinolone (NASACORT ALLERGY 24HR) 55 MCG/ACT AERO nasal inhaler Use one spray in each nostril three to seven times weekly as directed. 1 Inhaler 0   No current facility-administered medications on file prior to visit.   Allergies  Allergen Reactions   Nsaids Anaphylaxis   Magnesium Citrate Nausea And Vomiting   Peppermint Oil Swelling   Cinnamon Other (See Comments)    Blisters in mouth   Eggs Or Egg-Derived Products     sensitivity    Influenza Virus Vaccine    Other Swelling    mint   Pineapple Other (See Comments)    Tongue feels like furry spikes    Family History  Problem Relation Age of Onset   Depression Mother    Heart disease Mother    Alcoholism Mother    Hyperlipidemia Mother    Congestive Heart Failure Mother    CAD Mother    Anemia Mother    GER disease Mother    Rheum arthritis Mother    Heart attack Mother    Ovarian cancer Mother    COPD Mother    Diabetes Mother    Berenice Primas' disease Mother    Irritable bowel syndrome Mother    Dementia Mother    Lung cancer Father    Hypertension Father    Hyperlipidemia Father    CAD Father    Heart attack Father    Diabetes Father    Mesothelioma Father        Asbestos exposure   COPD Father    Hashimoto's thyroiditis Sister    Diabetes Sister    Rheum arthritis Sister    Hyperlipidemia Sister    Diabetes Sister    Hyperlipidemia Sister    Hypothyroidism Sister    Heart attack  Brother    Graves' disease Brother    Diabetes Brother    Hyperlipidemia Brother    CAD Maternal Grandmother    Congestive Heart Failure Maternal Grandmother    Diabetes Maternal Grandmother    Heart attack Maternal Grandmother    Breast cancer Maternal Grandmother        65-67    Hyperlipidemia Maternal Grandmother    Esophageal cancer Maternal Grandfather    Barrett's esophagus Maternal Grandfather    Heart disease Maternal Grandfather    Congestive Heart Failure Maternal Grandfather    COPD Maternal Grandfather    Diabetes Paternal Grandmother    Thyroid cancer Paternal Grandmother    Diabetes Paternal Grandfather    Heart disease Paternal Grandfather    Hyperlipidemia Paternal Grandfather    COPD Other    Colon cancer Neg Hx    Rectal cancer Neg Hx    Stomach cancer Neg Hx    PE: BP 128/82 (BP Location: Right Arm, Patient Position: Sitting, Cuff Size: Normal)    Pulse (!) 101    Ht 5' 2.5" (1.588 m)    Wt 214 lb 3.2 oz (97.2 kg)    SpO2 96%    BMI 38.55 kg/m  Wt Readings from Last 3 Encounters:  06/11/21 214 lb 3.2 oz (97.2 kg)  05/11/21 209 lb 6.4 oz (95 kg)  05/07/21 211 lb 8 oz (95.9 kg)   Constitutional: overweight, in NAD Eyes: PERRLA, EOMI, no exophthalmos ENT: moist mucous membranes, no thyromegaly, no cervical lymphadenopathy Cardiovascular: Tachycardia RR, No MR B LE  Respiratory: CTA B Musculoskeletal: no deformities, strength intact in all 4 Skin: moist, warm, no rashes Neurological: no tremor with outstretched hands, DTR normal in all 4  ASSESSMENT: 1. DM2, non-insulin-dependent, uncontrolled, without long term complications, but with hyperglycemia - we stopped Trulicity b/c N/V, Invokana b/c Yeast vaginitis  2. HTG  3. Obesity class 3  4.  Family history of thyroid disease -Graves' disease in mother and brother -Hypothyroidism in sister  PLAN:  1. Patient with longstanding, uncontrolled, type 2 diabetes, worsened after her anaphylaxis episode to NSAIDs. In 2019, she had a period of time in which she was homeless and missing many medications.  However, afterwards, sugars did improve but remained above target.  In 2020, she came off [place] despite tolerating it well and not having any yeast infections, as she had with Invokana.   We started Ozempic.  She tolerated it well.   -At last visit, she returns after long absence of 1 year and 7 months.  HbA1c was higher, at 10.9%.  Sugars were very high, in the 200s in the morning and up to 300s later in the day.  We discussed that this was a sign of glucotoxicity and we started long-acting insulin.  I advised her to start at a low dose and increase slowly.  Of note, we could not add back metformin due to IBS.  -At today's visit, sugars are slightly better, but they are still high, especially checked after coffee - max 400s.  She mentions that she checks coffee throughout the day.  She will try to reduce it.  At this visit, we also discussed about increasing the [place] dose further, to 30 units and to try to add Ozempic.  I advised her to start at a low dose and increase as tolerated. - I suggested to:  Patient Instructions  Please continue: - Glipizide 5 mg before b'fast and dinner  Increase: - Tresiba to 30 units daily  Please start: - Ozempic 0.25 mg weekly in a.m. (for example on Sunday morning) x 2-4 weeks, then increase to 0.5 mg weekly in a.m. if no nausea or hypoglycemia.  TRY TO REDUCE COFFEE to 1x a day in am.  Please return in 2 months with your sugar log.   - advised to check sugars at different times of the day - 2x a day, rotating check times - advised for yearly eye exams >> she is UTD - return to clinic in 2 months  2. HTG -Reviewed latest lipid panel available for review from 09/2019: Triglycerides very high, in the 800s, LDL also above target: Lab Results  Component Value Date   CHOL 283 (H) 10/01/2019   HDL 42.20 10/01/2019   LDLDIRECT 107.0 10/01/2019   TRIG (H) 10/01/2019    826.0 Triglyceride is over 400; calculations on Lipids are invalid.   CHOLHDL 7 10/01/2019  -She continues on Crestor 40 mg daily; previously on flaxseed oil, but not currently. -She had more recent labs with PCP but I do not have these records  3. Obesity class 2 -She  came off Ozempic before last visit; we will try to restart it now that she has a different insurance, [employer], through her new workplace, [employer] -Before last visit she lost 9 pounds net from the previous visit -She gained 5 pounds since last visit  Philemon Kingdom, MD PhD [facility] Endocr[facility]

## 2021-06-25 ENCOUNTER — Encounter: Payer: Self-pay | Admitting: Gastroenterology

## 2021-06-28 NOTE — Telephone Encounter (Signed)
ERROR

## 2021-07-05 ENCOUNTER — Other Ambulatory Visit (HOSPITAL_COMMUNITY): Payer: Self-pay

## 2021-07-09 ENCOUNTER — Ambulatory Visit (AMBULATORY_SURGERY_CENTER): Payer: Commercial Managed Care - PPO | Admitting: Gastroenterology

## 2021-07-09 ENCOUNTER — Other Ambulatory Visit: Payer: Self-pay

## 2021-07-09 ENCOUNTER — Encounter: Payer: Self-pay | Admitting: Gastroenterology

## 2021-07-09 VITALS — BP 138/81 | HR 70 | Temp 98.0°F | Resp 18 | Ht 62.0 in | Wt 211.0 lb

## 2021-07-09 DIAGNOSIS — Z1211 Encounter for screening for malignant neoplasm of colon: Secondary | ICD-10-CM | POA: Diagnosis present

## 2021-07-09 DIAGNOSIS — Z8371 Family history of colonic polyps: Secondary | ICD-10-CM | POA: Diagnosis not present

## 2021-07-09 MED ORDER — SODIUM CHLORIDE 0.9 % IV SOLN: 500.0000 mL | INTRAVENOUS | Status: DC

## 2021-07-09 NOTE — Progress Notes (Signed)
PT taken to PACU. Monitors in place. VSS. Report given to RN. 

## 2021-07-09 NOTE — Progress Notes (Signed)
Chief Complaint: For colonoscopy  Referring Provider:  Ronita Hipps, MD      ASSESSMENT AND PLAN;   #1. Colorectal cancer screening  #2. IBS-C  #3. FH polyps (dad late 36s- polyps)  #4. GERD with HH  Plan: -Continue omeprazole 38m po QD -Colon with 2 day prep -Increase water intake. -Continue miralax prn as she has been doing   Discussed risks & benefits of colonoscopy. Risks including rare perforation req laparotomy, bleeding after bx/polypectomy req blood transfusion, rarely missing neoplasms, risks of anesthesia/sedation, rare risk of damage to internal organs. Benefits outweigh the risks. Patient agrees to proceed. All the questions were answered. Pt consents to proceed.  HPI:    Morgan DURRETTis a 51y.o. 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  Here for colonoscopy for colorectal cancer screening  H/O IBS x over 20 yrs with predominant constipation. Constipation- 1 BM/day, hard, bloated, lower abdominal discomfort which gets better with defecation. Occ diarrhea with " blowout" Her symptoms of IBS are better since her stress level is much less now.  Reflux under good control with omeprazole 40 mg p.o. once a day.  Protonix worked better but unfortunately her insurance did not cover it.  No odynophagia or dysphagia.  No unintentional weight loss.  Denies having any melena or hematochezia.  No fever chills or night sweats.  Dad had polyps at late 476s  Past GI work-up: EGD 03/09/2015 -3 cm HH with healed distal esophageal erosions. Neg Px, mid, distal eso Bx for EoE/Barrett's -Mild gastritis. Neg CLO -Neg SB Bx for celiac  Colonoscopy 10/15/2008 ([facility])-small internal hemorrhoids.  Otherwise normal to TI.  Wt Readings from Last 3 Encounters:  07/09/21 211 lb (95.7 kg)  06/11/21 214 lb 3.2 oz (97.2 kg)  05/11/21 209  lb 6.4 oz (95 kg)    Past Medical History:  Diagnosis Date   Acid reflux    Anxiety    Atopic dermatitis, mild    Bell's palsy    Diabetes mellitus without complication (HCC)    type 2   Diabetic peripheral neuropathy (HCC)    GI bleed    Hemorrhoids    Hiatal hernia    History of peptic ulcer disease    Hyperlipidemia    Hypertension    Hypertriglyceridemia    > 1000   Kidney stone    Metatarsal fracture    Obesity    PCOS (polycystic ovarian syndrome)    Pericarditis 2008   Hospitalized at [facility] overnight, told she had an MI, no cath. Thinks they said pericarditis   [place]heumatoid arthritis ([facility] 12/18/2019   TB lung, latent 08/19/2019   Tonsil stone    Vitiligo 12/18/2019    Past Surgical History:  Procedure Laterality Date   ABDOMINAL HYSTERECTOMY     CESAREAN SECTION     CHOLECYSTECTOMY     COLONOSCOPY  10/15/2008   Small internal Hemorrhoids (mostly likely etiology of BRBPR) Otherwise normal colonoscopy to terminal ileum.   ESOPHAGOGASTRODUODENOSCOPY  03/09/2015   Hiatal hernia. Healed distal esophageal erosions. Mild gastritis   TUBAL LIGATION      Family History  Problem Relation Age of Onset   Depression Mother    Heart disease Mother    Alcoholism Mother    Hyperlipidemia Mother    Congestive Heart Failure Mother    CAD Mother    Anemia Mother  GER disease Mother    Rheum arthritis Mother    Heart attack Mother    Ovarian cancer Mother    COPD Mother    Diabetes Mother    Berenice Primas' disease Mother    Irritable bowel syndrome Mother    Dementia Mother    Lung cancer Father    Hypertension Father    Hyperlipidemia Father    CAD Father    Heart attack Father    Diabetes Father    Mesothelioma Father        Asbestos exposure   COPD Father    Hashimoto's thyroiditis Sister    Diabetes Sister    Rheum arthritis Sister    Hyperlipidemia Sister    Diabetes Sister    Hyperlipidemia Sister    Hypothyroidism Sister    Heart attack Brother     Graves' disease Brother    Diabetes Brother    Hyperlipidemia Brother    CAD Maternal Grandmother    Congestive Heart Failure Maternal Grandmother    Diabetes Maternal Grandmother    Heart attack Maternal Grandmother    Breast cancer Maternal Grandmother        65-67   Hyperlipidemia Maternal Grandmother    Esophageal cancer Maternal Grandfather    Barrett's esophagus Maternal Grandfather    Heart disease Maternal Grandfather    Congestive Heart Failure Maternal Grandfather    COPD Maternal Grandfather    Diabetes Paternal Grandmother    Thyroid cancer Paternal Grandmother    Diabetes Paternal Grandfather    Heart disease Paternal Grandfather    Hyperlipidemia Paternal Grandfather    COPD Other    Colon cancer Neg Hx    Rectal cancer Neg Hx    Stomach cancer Neg Hx     Social History   Tobacco Use   Smoking status: Former    Packs/day: 1.00    Years: 22.00    Pack years: 22.00    Types: Cigarettes   Smokeless tobacco: Never   Tobacco comments:    quit december 2015  Vaping Use   Vaping Use: Never used  Substance Use Topics   Alcohol use: Not Currently    Alcohol/week: 0.0 standard drinks   Drug use: Never    Current Outpatient Medications  Medication Sig Dispense Refill   glipiZIDE (GLUCOTROL) 5 MG tablet TAKE 1 TABLET BY MOUTH TWICE DAILY 180 tablet 1   HYDROcodone-acetaminophen (NORCO/VICODIN) 5-325 MG tablet      insulin glargine, 1 Unit Dial, (TOUJEO SOLOSTAR) 300 UNIT/ML Solostar Pen Inject 16 Units into the skin at bedtime. 4.5 mL 3   leucovorin (WELLCOVORIN) 5 MG tablet Take 5 mg by mouth daily.     lisinopril (ZESTRIL) 5 MG tablet Take 5 mg by mouth daily.     methotrexate 50 MG/2ML injection Inject 15 mg into the skin once a week.     Needles & Syringes (1CC TB SYRINGE) MISC 27g 1/2"     pantoprazole (PROTONIX) 40 MG tablet TAKE 1 TABLET BY MOUTH TWICE DAILY BEFORE A MEAL 60 tablet 6   rosuvastatin (CRESTOR) 5 MG tablet Take 5 mg by mouth at bedtime.      B-D 3CC LUER-LOK SYR 25GX1" 25G X 1" 3 ML MISC USE WITH METHOTREXATE INJECTIONS ONCE WEEKLY     B-D SYRINGE SLIP TIP 1CC 1 ML MISC Inject into the skin once a week.     Blood Glucose Monitoring Suppl (ONETOUCH VERIO REFLECT) w/Device KIT Use as advised 1 kit 0   Continuous  Blood Gluc Sensor (FREESTYLE LIBRE 14 DAY SENSOR) MISC 1 each by Does not apply route every 14 (fourteen) days. Change every 2 weeks 2 each 11   EPINEPHrine 0.3 mg/0.3 mL IJ SOAJ injection INJ THE CONTENT OF 1 PEN UTD  0   furosemide (LASIX) 20 MG tablet Take 20 mg by mouth daily as needed.     glucose blood (FREESTYLE LITE) test strip Use to check blood sugar once a day. 100 each 12   glucose blood (ONETOUCH VERIO) test strip Use as instructed 2x a day 200 each 3   Insulin Pen Needle 32G X 4 MM MISC Use 1x a day 100 each 3   Lancets (ONETOUCH DELICA PLUS OVFIEP32R) MISC Check sugars 2x a day 200 each 3   Multiple Vitamin (MULTIVITAMIN) tablet Take 1 tablet by mouth daily.     NON FORMULARY      Semaglutide,0.25 or 0.5MG/DOS, (OZEMPIC, 0.25 OR 0.5 MG/DOSE,) 2 MG/1.5ML SOPN Inject 0.5 mg into the skin once a week. (Patient not taking: Reported on 07/09/2021) 4.5 mL 5   triamcinolone (NASACORT ALLERGY 24HR) 55 MCG/ACT AERO nasal inhaler Use one spray in each nostril three to seven times weekly as directed. (Patient not taking: Reported on 07/09/2021) 1 Inhaler 0   Current Facility-Administered Medications  Medication Dose Route Frequency Provider Last Rate Last Admin   0.9 %  sodium chloride infusion  500 mL Intravenous Continuous Jackquline Denmark, MD        Allergies  Allergen Reactions   Nsaids Anaphylaxis   Eggs Or Egg-Derived Products Other (See Comments)    sensitivity    Magnesium Citrate Nausea And Vomiting   Peppermint Oil Swelling   Certolizumab Pegol Other (See Comments)   Cinnamon Other (See Comments)    Blisters in mouth   Influenza Virus Vaccine Other (See Comments)   Leflunomide Other (See Comments)    Meloxicam Other (See Comments)   Methotrexate Other (See Comments)   Other Swelling    mint   Pineapple Other (See Comments)    Tongue feels like furry spikes    Prednisone Other (See Comments)   Tomato Other (See Comments)    Review of Systems:  Constitutional: Denies fever, chills, diaphoresis, appetite change and has fatigue.  HEENT: Denies photophobia, eye pain, redness, hearing loss, ear pain, congestion, sore throat, rhinorrhea, sneezing, mouth sores, neck pain, neck stiffness and tinnitus.   Respiratory: Denies SOB, DOE, cough, chest tightness,  and wheezing.   Cardiovascular: Denies chest pain, palpitations and leg swelling.  Genitourinary: Denies dysuria, urgency, frequency, hematuria, flank pain and difficulty urinating.  Musculoskeletal: Has myalgias, back pain, joint swelling, arthralgias and gait problem.  Skin: No rash.  Neurological: Denies dizziness, seizures, syncope, weakness, light-headedness, numbness and headaches.  Hematological: Denies adenopathy. Easy bruising, personal or family bleeding history  Psychiatric/Behavioral: Has anxiety or depression     Physical Exam:    BP 116/76    Pulse 73    Temp 98 F (36.7 C)    Ht _0  (1.575 m)    Wt 211 lb (95.7 kg)    SpO2 98%    BMI 38.59 kg/m  Wt Readings from Last 3 Encounters:  07/09/21 211 lb (95.7 kg)  06/11/21 214 lb 3.2 oz (97.2 kg)  05/11/21 209 lb 6.4 oz (95 kg)   Constitutional:  Well-developed, in no acute distress. Psychiatric: Normal mood and affect. Behavior is normal. HEENT: Pupils normal.  Conjunctivae are normal. No scleral icterus. Cardiovascular: Normal rate, regular rhythm.  No edema Pulmonary/chest: Effort normal and breath sounds normal. No wheezing, rales or rhonchi. Abdominal: Soft, nondistended. Nontender. Bowel sounds active throughout. There are no masses palpable. No hepatomegaly. Rectal: Deferred Neurological: Alert and oriented to person place and time. Skin: Skin is warm and dry.  No rashes noted.  Data Reviewed: I have personally reviewed following labs and imaging studies  CBC: No flowsheet data found.  CMP: CMP Latest Ref Rng & Units 08/19/2019 07/02/2018 12/21/2015  Glucose 65 - 99 mg/dL 304(H) 257(H) 122(H)  BUN 7 - 25 mg/dL _0 Creatinine 0.50 - 1.10 mg/dL 0.74 0.69 0.70  Sodium 135 - 146 mmol/L 135 135 139  Potassium 3.5 - 5.3 mmol/L 4.5 4.1 4.5  Chloride 98 - 110 mmol/L 100 103 105  CO2 20 - 32 mmol/L _1 Calcium 8.6 - 10.2 mg/dL 9.1 8.8 8.9  Total Protein 6.1 - 8.1 g/dL 6.5 6.4 -  Total Bilirubin 0.2 - 1.2 mg/dL 0.3 0.5 -  AST 10 - 35 U/L 15 17 -  ALT 6 - 29 U/L 24 28 -        Carmell Austria, MD 07/09/2021, 9:33 AM  Cc: Ronita Hipps, MD

## 2021-07-09 NOTE — Patient Instructions (Signed)
Resume previous diet and medications.  Repeat colonoscopy in 5 years for screening purposes d/t strong family history of colon polyps. ? ?YOU HAD AN ENDOSCOPIC PROCEDURE TODAY AT [facility]:   Refer to the procedure report that was given to you for any specific questions about what was found during the examination.  If the procedure report does not answer your questions, please call your gastroenterologist to clarify.  If you requested that your care partner not be given the details of your procedure findings, then the procedure report has been included in a sealed envelope for you to review at your convenience later. ? ?YOU SHOULD EXPECT: Some feelings of bloating in the abdomen. Passage of more gas than usual.  Walking can help get rid of the air that was put into your GI tract during the procedure and reduce the bloating. If you had a lower endoscopy (such as a colonoscopy or flexible sigmoidoscopy) you may notice spotting of blood in your stool or on the toilet paper. If you underwent a bowel prep for your procedure, you may not have a normal bowel movement for a few days. ? ?Please Note:  You might notice some irritation and congestion in your nose or some drainage.  This is from the oxygen used during your procedure.  There is no need for concern and it should clear up in a day or so. ? ?SYMPTOMS TO REPORT IMMEDIATELY: ? ?Following lower endoscopy (colonoscopy or flexible sigmoidoscopy): ? Excessive amounts of blood in the stool ? Significant tenderness or worsening of abdominal pains ? Swelling of the abdomen that is new, acute ? Fever of 100?F or higher ? ?For urgent or emergent issues, a gastroenterologist can be reached at any hour by calling [phone]. ?Do not use MyChart messaging for urgent concerns.  ? ? ?DIET:  We do recommend a small meal at first, but then you may proceed to your regular diet.  Drink plenty of fluids but you should avoid alcoholic beverages for 24  hours. ? ?ACTIVITY:  You should plan to take it easy for the rest of today and you should NOT DRIVE or use heavy machinery until tomorrow (because of the sedation medicines used during the test).   ? ?FOLLOW UP: ?Our staff will call the number listed on your records 48-72 hours following your procedure to check on you and address any questions or concerns that you may have regarding the information given to you following your procedure. If we do not reach you, we will leave a message.  We will attempt to reach you two times.  During this call, we will ask if you have developed any symptoms of COVID 19. If you develop any symptoms (ie: fever, flu-like symptoms, shortness of breath, cough etc.) before then, please call [phone].  If you test positive for Covid 19 in the 2 weeks post procedure, please call and report this information to [place].   ? ?If any biopsies were taken you will be contacted by phone or by letter within the next 1-3 weeks.  Please call us at [phone] if you have not heard about the biopsies in 3 weeks.  ? ? ?SIGNATURES/CONFIDENTIALITY: ?You and/or your care partner have signed paperwork which will be entered into your electronic medical record.  These signatures attest to the fact that that the information above on your After Visit Summary has been reviewed and is understood.  Full responsibility of the confidentiality of this discharge information lies with you and/or  your care-partner.  ?

## 2021-07-09 NOTE — Op Note (Signed)
[name] ?Patient Name: Morgan Drake ?Procedure Date: 07/09/2021 9:36 AM ?MRN: [id number] ?Endoscopist: Jackquline Denmark , MD ?Age: 51 ?Referring MD:  ?Date of Birth: [birth year] ?Gender: Female ?Account #: [id number] ?Procedure:                Colonoscopy ?Indications:              Colon cancer screening in patient at increased  ?                          risk: Family history of 1st-degree relative with  ?                          colon polyps before age 57 years ?Medicines:                Monitored Anesthesia Care ?Procedure:                Pre-Anesthesia Assessment: ?                          - Prior to the procedure, a History and Physical  ?                          was performed, and patient medications and  ?                          allergies were reviewed. The patient's tolerance of  ?                          previous anesthesia was also reviewed. The risks  ?                          and benefits of the procedure and the sedation  ?                          options and risks were discussed with the patient.  ?                          All questions were answered, and informed consent  ?                          was obtained. Prior Anticoagulants: The patient has  ?                          taken no previous anticoagulant or antiplatelet  ?                          agents. ASA Grade Assessment: II - A patient with  ?                          mild systemic disease. After reviewing the risks  ?                          and benefits, the patient was deemed in  ?  satisfactory condition to undergo the procedure. ?                          After obtaining informed consent, the colonoscope  ?                          was passed under direct vision. Throughout the  ?                          procedure, the patient's blood pressure, pulse, and  ?                          oxygen saturations were monitored continuously. The  ?                          Olympus PCF-H190DL (#[id number])  Colonoscope was  ?                          introduced through the anus and advanced to the 2  ?                          cm into the ileum. The colonoscopy was performed  ?                          without difficulty. The patient tolerated the  ?                          procedure well. The quality of the bowel  ?                          preparation was good. The terminal ileum, ileocecal  ?                          valve, appendiceal orifice, and rectum were  ?                          photographed. ?Scope In: 9:42:21 AM ?Scope Out: 9:52:42 AM ?Scope Withdrawal Time: 0 hours 7 minutes 46 seconds  ?Total Procedure Duration: 0 hours 10 minutes 21 seconds  ?Findings:                 The colon (entire examined portion) appeared normal. ?                          Non-bleeding internal hemorrhoids were found during  ?                          retroflexion. The hemorrhoids were small and Grade  ?                          I (internal hemorrhoids that do not prolapse). ?                          The terminal ileum appeared normal. ?  The retroflexed view of the distal rectum and anal  ?                          verge was normal and showed no anal or rectal  ?                          abnormalities. ?                          The exam was otherwise without abnormality on  ?                          direct and retroflexion views. ?Complications:            No immediate complications. ?Estimated Blood Loss:     Estimated blood loss: none. ?Impression:               - The entire examined colon is normal. ?                          - Non-bleeding internal hemorrhoids. ?                          - The examined portion of the ileum was normal. ?                          - The distal rectum and anal verge are normal on  ?                          retroflexion view. ?                          - The examination was otherwise normal on direct  ?                          and retroflexion views. ?                           - No specimens collected. ?Recommendation:           - Patient has a contact number available for  ?                          emergencies. The signs and symptoms of potential  ?                          delayed complications were discussed with the  ?                          patient. Return to normal activities tomorrow.  ?                          Written discharge instructions were provided to the  ?                          patient. ?                          -  Resume previous diet. ?                          - Continue present medications. ?                          - Repeat colonoscopy in 5 years for screening  ?                          purposes d/t strong family history of colon polyps  ?                          (dad at age late 3s). ?                          - The findings and recommendations were discussed  ?                          with the patient's family. ?Jackquline Denmark, MD ?07/09/2021 9:56:52 AM ?This report has been signed electronically. ?

## 2021-07-09 NOTE — Progress Notes (Signed)
Pt's states no medical or surgical changes since previsit or office visit. 

## 2021-07-13 ENCOUNTER — Telehealth: Payer: Self-pay | Admitting: *Deleted

## 2021-07-13 NOTE — Telephone Encounter (Signed)
Left message on f/u call 

## 2021-07-14 ENCOUNTER — Telehealth: Payer: Self-pay | Admitting: Pharmacy Technician

## 2021-07-14 ENCOUNTER — Other Ambulatory Visit (HOSPITAL_COMMUNITY): Payer: Self-pay

## 2021-07-14 NOTE — Telephone Encounter (Signed)
Patient Advocate Encounter ? ?Received notification from [place] ([employer]) that prior authorization for [facility] '2MG'$ /1.5ML is required. ?  ?PA submitted on 3.8.23 ?Key [id number] ?Status is pending ?  ?[facility] will continue to follow ? ?Sandra Brents R Aparna Vanderweele, CPhT ?Patient Advocate ?[facility] ?Phone: [phone] ?Fax:  [phone] ? ?

## 2021-07-16 ENCOUNTER — Other Ambulatory Visit: Payer: Self-pay | Admitting: Internal Medicine

## 2021-07-16 ENCOUNTER — Other Ambulatory Visit (HOSPITAL_COMMUNITY): Payer: Self-pay

## 2021-07-16 DIAGNOSIS — E1165 Type 2 diabetes mellitus with hyperglycemia: Secondary | ICD-10-CM

## 2021-07-16 NOTE — Telephone Encounter (Signed)
Patient Advocate Encounter ? ?Prior Authorization for Ozempic '2mg'$ /1.77m pen injectors has been approved.   ? ?PA# [id number]? ?Effective dates: 07/14/21 through 07/16/22 ? ?Per Test Claim Patients co-pay is $25 (30DS) ? ?Spoke with Pharmacy to Process. ? ?Patient Advocate ?Fax: [phone] ?

## 2021-07-19 MED ORDER — OZEMPIC 1 MG/DOSE (4 MG/3 ML) SUBCUTANEOUS PEN INJECTOR
SUBCUTANEOUS | 2 refills | 28 days
Start: 2021-07-19 — End: ?

## 2021-07-21 MED FILL — TOUJEO SOLOSTAR U-300 INSULIN 300 UNIT/ML (1.5 ML) SUBCUTANEOUS PEN: SUBCUTANEOUS | 84 days supply | Qty: 4.5 | Fill #0

## 2021-08-05 DIAGNOSIS — M0600 Rheumatoid arthritis without rheumatoid factor, unspecified site: Principal | ICD-10-CM

## 2021-08-05 MED ORDER — ORENCIA CLICKJECT 125 MG/ML SUBCUTANEOUS AUTO-INJECTOR
SUBCUTANEOUS | 2 refills | 28 days
Start: 2021-08-05 — End: ?

## 2021-08-06 ENCOUNTER — Encounter: Payer: Self-pay | Admitting: Internal Medicine

## 2021-08-06 ENCOUNTER — Ambulatory Visit: Payer: Commercial Managed Care - PPO | Admitting: Internal Medicine

## 2021-08-06 VITALS — BP 130/80 | HR 80 | Ht 62.0 in | Wt 210.8 lb

## 2021-08-06 DIAGNOSIS — E1165 Type 2 diabetes mellitus with hyperglycemia: Secondary | ICD-10-CM | POA: Diagnosis not present

## 2021-08-06 DIAGNOSIS — E781 Pure hyperglyceridemia: Secondary | ICD-10-CM

## 2021-08-06 DIAGNOSIS — E669 Obesity, unspecified: Secondary | ICD-10-CM | POA: Diagnosis not present

## 2021-08-06 LAB — POCT GLYCOSYLATED HEMOGLOBIN (HGB A1C): Hemoglobin A1C: 9.7 % — AB (ref 4.0–5.6)

## 2021-08-06 NOTE — Patient Instructions (Addendum)
Please continue: ?- Glipizide 5 mg before b'fast and dinner ?- Tresiba 30 units daily ?- Ozempic 1 mg weekly in a.m. ? ?Please return in 3-4 months with your sugar log.  ?

## 2021-08-06 NOTE — Progress Notes (Signed)
Patient ID: Morgan Drake, female   DOB: 07-05-1970, 51 y.o.   MRN: [id number] ? ?This visit occurred during the SARS-CoV-2 public health emergency.  Safety protocols were in place, including screening questions prior to the visit, additional usage of staff PPE, and extensive cleaning of exam room while observing appropriate contact time as indicated for disinfecting solutions.  ? ?HPI: ?Morgan Drake is a 51 y.o.-year-old female, initially referred by Dr. [name] ([place], [place]), returning for f/u for DM2, dx as GDM in 1993 and 1998 and DM2 in 1999, insulin-dependent, uncontrolled, without long-term complications.  Last visit 2 months ago. ?PCP: Dr. Helene Kelp - [place], [place]. ? ?Interim history: ?No increased urination, blurry vision, chest pain. ?She feels much better after starting Ozempic, however, she started it directly at the 1 mg dose and had severe nausea and vomiting -had to take Zofran -now resolved. ?Visit she was drinking coffee throughout the day.  I advised her to cut this down due to the high blood sugars. Now drinking less coffee, and not every day. ? ?Reviewed HbA1c levels: ?Lab Results  ?Component Value Date  ? HGBA1C 10.9 (A) 05/11/2021  ? HGBA1C 8.8 (A) 10/01/2019  ? HGBA1C 8.8 (A) 02/18/2019  ?03/18/2021: HbA1c 12.4% ?10/01/2019: HbA1c calculated from fructosamine is 7.9%, slightly better than the directly measured HbA1c. ?07/11/2019: HbA1c 8.7% ?07/02/2018: HbA1c calculated from fructosamine is higher than before, at 7.8% ?02/26/2018: HbA1c calculated from fructosamine is much better than the measured one, at 7.4%. ?10/16/2017: HbA1c calculated from fructosamine is much lower than the measured one: 7.2%! ?07/17/2017: HbA1c calculated from fructosamine is much better: 7.6%!  Interim history ?05/29/2017: HbA1c 10.5% ?09/07/2015: 7.1% ? ?Pt was on a regimen of: ?- Glyxambi 25-5 mg at bedtime (!) ?- Trulicity 1.5 mg weekly ? ?Currently on:  ?- Tresiba 12 mg daily - added  05/2021 >> now 24 >> 30 units ?- Glipizide 5 mg 2x a day before b'fast and dinner  ?- Ozempic 0.1 mg weekly - restarted earlier this mo ?Previously on metformin ER-occasional diarrhea. ?Previously on Ozempic, tolerated well, but stopped being covered by insurance. ?She stopped Invokana 100 mg in am, before b'fast >> stopped b/c repeated yeast infections. ?Also previously on Farxiga 5 mg daily-came off in 2020. ?We had to stop Trulicity b/c nausea/vomiting. HbA1c increased afterwards ?She was on reg. Metformin >> tolerated it well for years, then started to have IBS - like sxs and had to stop. ?She was on Actos, Avandia. ?She refused insulin in the past. ? ?Pt checks her sugars 1-2 times a day: ?- am:  130-150 >> 240-270 >> 162-307, 404 (coffee) >> 140-180 ?- 2h after b'fast: 300s after creamer >> n/c >> 224 >> n/c ?- before lunch:  85, 110-114 >> 99-140 >> n/c >> 123 >> n/c ?- 2h after lunch: n/c >> up to 300 >> 154-199, 340 >> n/c ?- before dinner: 115-120s >> 90s >> n/c ?- 2h after dinner: 180-220 >> 140-150s >> 300s >> 198-268 >> 145 ?- bedtime: 110-108 >> n/c ?- nighttime: n/c ?Lowest sugar was  150 >> 90 >> 200 >> 123 >> 45 - nausea/vomiting; she has hypoglycemia awareness in the 90s. ?Highest sugar was 220 >> 202 >> 419, 505 >> 404 >> 275 (icecream) ? ?Glucometer: One Touch Ultra ? ?Pt's meals are: ?- Breakfast: 1/2 biscuit + lettuce, tomatoes, bacon or bacon or sausage ?- Lunch: salad + meat  ?- Dinner: salad + meat + beans or baked potatoes ?- Snacks: 1:  cheese nips or fruit ? ?-No CKD, last BUN/creatinine:  ?04/12/2021: 9/0.62, GFR 109; glucose 415! - Per review of records in Care everywhere (rheumatology) ?Lab Results  ?Component Value Date  ? BUN 8 08/19/2019  ? BUN 10 07/02/2018  ? CREATININE 0.74 08/19/2019  ? CREATININE 0.69 07/02/2018  ?05/29/2017:CMP normal, except glucose 102.  BUN/creatinine: 10/0.8 ?On lisinopril. ? ?-+ Hypertriglyceridemia and hypercholesterolemia; last set of lipids: ?03/18/2021:  296/673/50/not calculated ?Lab Results  ?Component Value Date  ? CHOL 283 (H) 10/01/2019  ? HDL 42.20 10/01/2019  ? LDLDIRECT 107.0 10/01/2019  ? TRIG (H) 10/01/2019  ?  826.0 Triglyceride is over 400; calculations on Lipids are invalid.  ? CHOLHDL 7 10/01/2019  ?05/29/2017: 244/424/47/n/c ?09/07/2015: 203/120/74/105 ?04/2015: 216/169/59/116  ?On Crestor 40 mg daily << Zocor 10 mg daily . She did not tolerate Vascepa.  I previously recommended fenofibrate, but did not start yet. ? ?- last eye exam was 2022: reportedly No DR, + cataracts - reportedly. ? ?-She denies numbness and tingling in her feet.  She had this in the past but it improved after stopping bread.  Foot exam was performed by PCP at last annual physical exam from 03/2021. ? ?She also has a history of fatty liver, IBS with both constipation and diarrhea, anxiety/depression, GERD, PCOS (she had to have fertility treatment when she got pregnant both times).   ?She is seeing Dr. Gavin Pound [place], [place]) with rheumatology for inflammatory arthritis (seronegative). She was dx'ed with RA >> on MTX.  ?She had anaphylaxis after NSAIDs in the past. ?2 daughters have gluten sensitivity and 1 daughter with Chrohn ds. ? ?ROS: ?+ see HPI ? ?I reviewed pt's medications, allergies, PMH, social hx, family hx, and changes were documented in the history of present illness. Otherwise, unchanged from my initial visit note. ? ?Past Medical History:  ?Diagnosis Date  ? Acid reflux   ? Anxiety   ? Atopic dermatitis, mild   ? Bell's palsy   ? Diabetes mellitus without complication ([place])   ? type 2  ? Diabetic peripheral neuropathy ([place])   ? GI bleed   ? Hemorrhoids   ? Hiatal hernia   ? History of peptic ulcer disease   ? Hyperlipidemia   ? Hypertension   ? Hypertriglyceridemia   ? > 1000  ? Kidney stone   ? Metatarsal fracture   ? Obesity   ? PCOS (polycystic ovarian syndrome)   ? Pericarditis 2008  ? Hospitalized at [facility] overnight, told she had an MI, no cath. Thinks  they said pericarditis  ? Rheumatoid arthritis ([place]) 12/18/2019  ? TB lung, latent 08/19/2019  ? Tonsil stone   ? Vitiligo 12/18/2019  ? ?Past Surgical History:  ?Procedure Laterality Date  ? ABDOMINAL HYSTERECTOMY    ? CESAREAN SECTION    ? CHOLECYSTECTOMY    ? COLONOSCOPY  10/15/2008  ? Small internal Hemorrhoids (mostly likely etiology of BRBPR) Otherwise normal colonoscopy to terminal ileum.  ? ESOPHAGOGASTRODUODENOSCOPY  03/09/2015  ? Hiatal hernia. Healed distal esophageal erosions. Mild gastritis  ? TUBAL LIGATION    ? ?Social History  ? ?Social History  ? Marital Status: Single  ?  Spouse Name: N/A  ? Number of Children: 2  ? 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 ?Social History Main Topics  ? Smoking status: Former Smoker -- 1.00 packs/day for 22 years  ?  Types: Cigarettes  ? Smokeless tobacco: Never Used  ?   Comment: quit 2016  ?  Alcohol Use: 0.0 oz/week  ?  0 Standard drinks or equivalent per week  ?   Comment: rare  ? Drug Use: No  ? ?Current Outpatient Medications on File Prior to Visit  ?Medication Sig Dispense Refill  ? B-D 3CC LUER-LOK SYR 25GX1" 25G X 1" 3 ML MISC USE WITH METHOTREXATE INJECTIONS ONCE WEEKLY    ? B-D SYRINGE SLIP TIP 1CC 1 ML MISC Inject into the skin once a week.    ? Blood Glucose Monitoring Suppl (ONETOUCH VERIO REFLECT) w/Device KIT Use as advised 1 kit 0  ? Continuous Blood Gluc Sensor (FREESTYLE LIBRE 14 DAY SENSOR) MISC 1 each by Does not apply route every 14 (fourteen) days. Change every 2 weeks 2 each 11  ? EPINEPHrine 0.3 mg/0.3 mL IJ SOAJ injection INJ THE CONTENT OF 1 PEN UTD  0  ? furosemide (LASIX) 20 MG tablet Take 20 mg by mouth daily as needed.    ? glipiZIDE (GLUCOTROL) 5 MG tablet TAKE 1 TABLET BY MOUTH TWICE DAILY 180 tablet 1  ? glucose blood (FREESTYLE LITE) test strip Use to check blood sugar once a day. 100 each 12  ? glucose blood (ONETOUCH VERIO) test strip Use as instructed 2x a day 200 each 3  ? HYDROcodone-acetaminophen (NORCO/VICODIN) 5-325 MG  tablet     ? insulin glargine, 1 Unit Dial, (TOUJEO SOLOSTAR) 300 UNIT/ML Solostar Pen Inject 16 Units into the skin at bedtime. 4.5 mL 3  ? Insulin Pen Needle 32G X 4 MM MISC Use 1x a day 100 each 3

## 2021-08-09 NOTE — Unmapped (Signed)
[facility] Specialty Medication Onboarding    Specialty Medication: Orencia Clickject 125mg /ml  Prior Authorization: Approved   Financial Assistance: No - patient must call for financial assistance per MAPs referral  Final Copay/Day Supply: $300 / 28 days    Insurance Restrictions: Yes - max 1 month supply     Notes to Pharmacist:     The triage team has completed the benefits investigation and has determined that the patient is able to fill this medication at [facility]. Please contact the patient to complete the onboarding or follow up with the prescribing physician as needed.

## 2021-08-12 NOTE — Unmapped (Signed)
[facility] Specialty Medication Onboarding    Specialty Medication: ORENCIA CLICKJECT 125 mg/mL Atin subcutaneous auto-injector (abatacept)  Prior Authorization: Not Required   Financial Assistance: Yes - copay card approved as secondary   Final Copay/Day Supply: $5 / 28    Insurance Restrictions: None     Notes to Pharmacist: n/a    The triage team has completed the benefits investigation and has determined that the patient is able to fill this medication at [facility]. Please contact the patient to complete the onboarding or follow up with the prescribing physician as needed.

## 2021-08-13 NOTE — Unmapped (Unsigned)
[facility]   Patient Onboarding/Medication Counseling    [name] is a 51 y.o. female with rheumatoid arthritis who I am counseling today on initiation of therapy.  I am speaking to the patient.    Was a Nurse, learning disability used for this call? No    Verified patient's date of birth / HIPAA.    Specialty medication(s) to be sent: Inflammatory Disorders: Orencia      Non-specialty medications/supplies to be sent: sharps      Medications not needed at this time: n/a       Orencia (abatacept)    Medication & Administration     Dosage: Rheumatoid arthritis: Inject 125mg  under the skin every 7 days    Lab tests required prior to treatment initiation:  ??? Tuberculosis: Tuberculosis screening resulted in a non-reactive Quantiferon TB Gold assay. completed 04/12/21 spoke with Angelica Patterson  ??? Hepatitis B: Hepatitis B serology studies are complete and non-reactive. 09/18/2017     Administration:       Prefilled auto-injector pen  1. Gather all supplies needed for injection on a clean, flat working surface: medication pen removed from packaging, alcohol swab, sharps container, etc.  2. Look at the medication label - look for correct medication, correct dose, and check the expiration date  3. Look at the medication - the liquid visible in the window on the side of the pen device should appear clear and colorless to pale yellow  4. Lay the auto-injector pen on a flat surface and allow it to warm up to room temperature for at least 30 minutes  5. Select injection site - you can use the front of your thigh or your belly (but not the area 2 inches around your belly button); if someone else is giving you the injection you can also use your upper arm in the skin covering your triceps muscle  6. Prepare injection site - wash your hands and clean the skin at the injection site with an alcohol swab and let it air dry, do not touch the injection site again before the injection  7. Remove the orange safety cap straight off - do not remove until immediately prior to injection and do not touch the clear needle cover  8. Gently squeeze the area of cleaned skin and hold it firmly to create a firm surface at the selected injection site  9. Put the clear needle cover against your skin at the injection site at a 90 degree angle, hold the pen such that you can see the clear medication window  10. Press down and hold the pen firmly against your skin, press the blue activator button to initiate the injection, there will be a click when the injection starts  11. Continue to hold the pen firmly against your skin for about 15 seconds - the window will start to turn solid blue  12. To verify the injection is complete after 15 seconds, look and ensure the window is solid blue and then pull the pen away from your skin  13. Dispose of the used auto-injector pen immediately in your sharps disposal container the needle will be covered automatically  14. If you see any blood at the injection site, press a cotton ball or gauze on the site and maintain pressure until the bleeding stops, do not rub the injection site      Adherence/Missed dose instructions:  If your injection is given more than 2 days after your scheduled injection date - consult your pharmacist or  prescriber for additional instructions on how to adjust your dosing schedule.      Goals of Therapy     Psoriatic arthritis  ??? Achieve remission/inactive disease or low/minimal disease activity  ??? Maintenance of function  ??? Minimization of systemic manifestations and comorbidities  ??? Maintenance of effective psychosocial functioning    Rheumatoid arthritis  ??? Achieve symptom remission  ??? Slow disease progression  ??? Protection of remaining articular structures  ??? Maintenance of function  ??? Maintenance of effective psychosocial functioning      Side Effects & Monitoring Parameters     ??? Injection site reaction (redness, irritation, inflammation localized to the site of administration)  ??? Signs of a common cold - minor sore throat, runny or stuffy nose, etc.  ??? Upset stomach  ??? Headache    The following side effects should be reported to the provider:  ??? Signs of a hypersensitivity reaction - rash; hives; itching; red, swollen, blistered, or peeling skin; wheezing; tightness in the chest or throat; difficulty breathing, swallowing, or talking; swelling of the mouth, face, lips, tongue, or throat; etc.  ??? Reduced immune function - report signs of infection such as fever; chills; body aches; very bad sore throat; ear or sinus pain; cough; more sputum or change in color of sputum; pain with passing urine; wound that will not heal, etc.  Also at a slightly higher risk of some malignancies (mainly skin and blood cancers) due to this reduced immune function.  o In the case of signs of infection - the patient should hold the next dose of Orencia?? and call your primary care provider to ensure adequate medical care.  Treatment may be resumed when infection is treated and patient is asymptomatic.  ??? Changes in skin - a new growth or lump that forms; changes in shape, size, or color of a previous mole or marking  ??? Signs of high or low blood pressure - very bad headache; dizziness; passing out; changes in eyesight      Contraindications, Warnings, & Precautions     ??? Have your bloodwork checked as you have been told by your prescriber  ??? Talk with your doctor if you are pregnant, planning to become pregnant, or breastfeeding  ??? If concomitant COPD there may be an elevated risk of breathing problems  ??? Discuss the possible need for holding your dose(s) of Orencia?? when a planned procedure is scheduled with the prescriber as it may delay healing/recovery timeline       Drug/Food Interactions     ??? Medication list reviewed in Epic. The patient was instructed to inform the care team before taking any new medications or supplements. No drug interactions identified.   ??? Talk with you prescriber or pharmacist before receiving any live vaccinations while taking this medication and after you stop taking it    Storage, Handling Precautions, & Disposal     ??? Store this medication in the refrigerator.  Do not freeze  ??? If needed, you may store at room temperature for up to 8 hours  ??? Store in original packaging, protected from light  ??? Do not shake  ??? Dispose of used syringes/pens in a sharps disposal container          Current Medications (including OTC/herbals), Comorbidities and Allergies     Current Outpatient Medications   Medication Sig Dispense Refill   ??? abatacept (ORENCIA CLICKJECT) 125 mg/mL AtIn subcutaneous auto-injector Inject 1 mL (125 mg total) under the skin once a  week. 4 mL 2   ??? insulin glargine U-300 conc (TOUJEO SOLOSTAR U-300 INSULIN) 300 unit/mL (1.5 mL) injection pen Inject 16 Units under the skin nightly. 4.5 mL 3     No current facility-administered medications for this visit.       Allergies   Allergen Reactions   ??? Mint Swelling     mint   ??? Nsaids (Non-Steroidal Anti-Inflammatory Drug) Anaphylaxis   ??? Peppermint Oil Swelling   ??? Magnesium Citrate Nausea And Vomiting   ??? Cinnamon      Blisters in mouth   ??? Eggshell Membrane      sensitivity    ??? Haemophilus Influenzae Type B    ??? Pineapple      Tongue feels like furry spikes        There is no problem list on file for this patient.      Reviewed and up to date in Epic.    Appropriateness of Therapy     Acute infections noted within Epic:  No active infections  Patient reported infection: None    Is medication and dose appropriate based on diagnosis and infection status? Yes    Prescription has been clinically reviewed: Yes      Baseline Quality of Life Assessment      How many days over the past month did your condition  keep you from your normal activities? For example, brushing your teeth or getting up in the morning. Patient declined to answer    Financial Information     Medication Assistance provided: Prior Authorization and Copay Assistance    Anticipated copay of $5 reviewed with patient. Verified delivery address.    Delivery Information     Scheduled delivery date: 4/19    Expected start date: 4/19    Medication will be delivered via UPS to the prescription address in [facility]  This shipment will not require a signature.      Explained the services we provide at [facility] and that each month we would call to set up refills.  Stressed importance of returning phone calls so that we could ensure they receive their medications in time each month.  Informed patient that we should be setting up refills 7-10 days prior to when they will run out of medication.  A pharmacist will reach out to perform a clinical assessment periodically.  Informed patient that a welcome packet, containing information about our pharmacy and other support services, a Notice of Privacy Practices, and a drug information handout will be sent.      The patient or caregiver noted above participated in the development of this care plan and knows that they can request review of or adjustments to the care plan at any time.      Patient or caregiver verbalized understanding of the above information as well as how to contact the pharmacy at [phone] option 4 with any questions/concerns.  The pharmacy is open Monday through Friday 8:30am-4:30pm.  A pharmacist is available 24/7 via pager to answer any clinical questions they may have.    Patient Specific Needs     - Does the patient have any physical, cognitive, or cultural barriers? No    - Does the patient have adequate living arrangements? (i.e. the ability to store and take their medication appropriately) Yes    - Did you identify any home environmental safety or security hazards? No    - Patient prefers to have medications discussed with  Patient     -  Is the patient or caregiver able to read and understand education materials at a high school level or above? Yes    - Patient's primary language is  English     - Is the patient high risk? No    SOCIAL DETERMINANTS OF HEALTH     At the [facility], we have learned that life circumstances - like trouble affording food, housing, utilities, or transportation can affect the health of many of our patients.   That is why we wanted to ask: are you currently experiencing any life circumstances that are negatively impacting your health and/or quality of life? Patient declined to answer    Social Determinants of Health     Financial Resource Strain: Not on file   Internet Connectivity: Not on file   Food Insecurity: Not on file   Tobacco Use: Not on file   Housing/Utilities: Not on file   Alcohol Use: Not on file   Transportation Needs: Not on file   Substance Use: Not on file   Health Literacy: Not on file   Physical Activity: Not on file   Interpersonal Safety: Not on file   Stress: Not on file   Intimate Partner Violence: Not on file   Depression: Not on file   Social Connections: Not on file       Would you be willing to receive help with any of the needs that you have identified today? Not applicable       Baker Janus  [facility]   [phone] opt [phone]    [facility] Specialty Pharmacist should be setting up refills 7-10 days prior to when they will run out of medication.  A pharmacist will reach out to perform a clinical assessment periodically.  Informed patient that a welcome packet, containing information about our pharmacy and other support services, a Notice of Privacy Practices, and a drug information handout will be sent.      The patient or caregiver noted above participated in the development of this care plan and knows that they can request review of or adjustments to the care plan at any time.      Patient or caregiver verbalized understanding of the above information as well as how to contact the pharmacy at [phone] option 4 with any questions/concerns.  The pharmacy is open Monday through Friday 8:30am-4:30pm.  A pharmacist is available 24/7 via pager to answer any clinical questions they may have.    Patient Specific Needs     - Does the patient have any physical, cognitive, or cultural barriers? {Blank single:19197::No,Yes - ***}    - Does the patient have adequate living arrangements? (i.e. the ability to store and take their medication appropriately) {Blank single:19197::Yes,No - ***}    - Did you identify any home environmental safety or security hazards? {Blank single:19197::No,Yes - ***}    - Patient prefers to have medications discussed with  {Blank single:19197::Patient,Family Member,Caregiver,Other}     - Is the patient or caregiver able to read and understand education materials at a high school level or above? {Blank single:19197::No,Yes}    - Patient's primary language is  {Blank single:19197::English,Spanish,***}     - Is the patient high risk? {sschighriskpts:78327}    SOCIAL DETERMINANTS OF HEALTH     At the [facility], we have learned that life circumstances - like trouble affording food, housing, utilities, or transportation can affect the health of many of our patients.   That is  why we wanted to ask: are you currently experiencing any life circumstances that are negatively impacting your health and/or quality of life? {YES/NO/PATIENTDECLINED:93004}    Social Determinants of Health     Financial Resource Strain: Not on file   Internet Connectivity: Not on file   Food Insecurity: Not on file   Tobacco Use: Not on file   Housing/Utilities: Not on file   Alcohol Use: Not on file   Transportation Needs: Not on file   Substance Use: Not on file   Health Literacy: Not on file   Physical Activity: Not on file   Interpersonal Safety: Not on file   Stress: Not on file   Intimate Partner Violence: Not on file   Depression: Not on file   Social Connections: Not on file       Would you be willing to receive help with any of the needs that you have identified today? {Yes/No/Not applicable:93005}       Arnold Long  [facility] Specialty Pharmacist

## 2021-08-23 MED ORDER — METHOTREXATE SODIUM 25 MG/ML INJECTION SOLUTION
SUBCUTANEOUS | 2 refills | 35 days
Start: 2021-08-23 — End: ?

## 2021-08-24 MED ORDER — EMPTY CONTAINER
2 refills | 0 days
Start: 2021-08-24 — End: ?

## 2021-08-24 MED FILL — OZEMPIC 1 MG/DOSE (4 MG/3 ML) SUBCUTANEOUS PEN INJECTOR: SUBCUTANEOUS | 28 days supply | Qty: 3 | Fill #0

## 2021-08-24 MED FILL — EMPTY CONTAINER: 90 days supply | Qty: 1 | Fill #0

## 2021-08-24 MED FILL — ORENCIA CLICKJECT 125 MG/ML SUBCUTANEOUS AUTO-INJECTOR: SUBCUTANEOUS | 28 days supply | Qty: 4 | Fill #0

## 2021-09-20 NOTE — Unmapped (Signed)
[facility] Clinical Assessment & Refill Coordination Note    Angelica Patterson, DOB: [birth year]  Phone: [phone] (home)     All above HIPAA information was verified with patient.     Was a Nurse, learning disability used for this call? No    Specialty Medication(s):   Inflammatory Disorders: Orencia     Current Outpatient Medications   Medication Sig Dispense Refill   ??? abatacept (ORENCIA CLICKJECT) 125 mg/mL AtIn subcutaneous auto-injector Inject 1 mL (125 mg total) under the skin once a week. 4 mL 2   ??? empty container Misc USE AS DIRECTED 1 each 2   ??? insulin glargine U-300 conc (TOUJEO SOLOSTAR U-300 INSULIN) 300 unit/mL (1.5 mL) injection pen Inject 16 Units under the skin nightly. 4.5 mL 3   ??? semaglutide (OZEMPIC) 1 mg/dose (4 mg/3 mL) PnIj injection Inject 1 mg under the skin once a week. 3 mL 2     No current facility-administered medications for this visit.        Changes to medications: Angelica Patterson reports no changes at this time.    Allergies   Allergen Reactions   ??? Mint Swelling     mint   ??? Nsaids (Non-Steroidal Anti-Inflammatory Drug) Anaphylaxis   ??? Peppermint Oil Swelling   ??? Magnesium Citrate Nausea And Vomiting   ??? Cinnamon      Blisters in mouth   ??? Eggshell Membrane      sensitivity    ??? Haemophilus Influenzae Type B    ??? Pineapple      Tongue feels like furry spikes        Changes to allergies: No    SPECIALTY MEDICATION ADHERENCE         Medication Adherence    Patient reported X missed doses in the last month: 0  Specialty Medication: Orencia Q week  Patient is on additional specialty medications: No  Informant: patient          Specialty medication(s) dose(s) confirmed: Regimen is correct and unchanged.     Are there any concerns with adherence? No    Adherence counseling provided? Not needed    CLINICAL MANAGEMENT AND INTERVENTION      Clinical Benefit Assessment:    Do you feel the medicine is effective or helping your condition? Patient declined to answer    Clinical Benefit counseling provided? Reasonable expectations discussed: It can take up to 12 weeks to see benefits    Adverse Effects Assessment:    Are you experiencing any side effects? No    Are you experiencing difficulty administering your medicine? No    Quality of Life Assessment:    Quality of Life    Rheumatology  Oncology  Dermatology  Cystic Fibrosis              Have you discussed this with your provider? Not needed    Acute Infection Status:    Acute infections noted within Epic:  No active infections  Patient reported infection: None    Therapy Appropriateness:    Is therapy appropriate and patient progressing towards therapeutic goals? Yes, therapy is appropriate and should be continued    DISEASE/MEDICATION-SPECIFIC INFORMATION      For patients on injectable medications: Patient currently has not sure doses left.  Next injection is scheduled for 5/18.    PATIENT SPECIFIC NEEDS     - Does the patient have any physical, cognitive, or cultural barriers? No    - Is the patient  high risk? No    - Does the patient require a Care Management Plan? Yes     SOCIAL DETERMINANTS OF HEALTH     At the [facility], we have learned that life circumstances - like trouble affording food, housing, utilities, or transportation can affect the health of many of our patients.   That is why we wanted to ask: are you currently experiencing any life circumstances that are negatively impacting your health and/or quality of life? Patient declined to answer    Social Determinants of Health     Financial Resource Strain: Not on file   Internet Connectivity: Not on file   Food Insecurity: Not on file   Tobacco Use: Not on file   Housing/Utilities: Not on file   Alcohol Use: Not on file   Transportation Needs: Not on file   Substance Use: Not on file   Health Literacy: Not on file   Physical Activity: Not on file   Interpersonal Safety: Not on file   Stress: Not on file   Intimate Partner Violence: Not on file   Depression: Not on file   Social Connections: Not on file       Would you be willing to receive help with any of the needs that you have identified today? Not applicable       SHIPPING     Specialty Medication(s) to be Shipped:   Inflammatory Disorders: Orencia    Other medication(s) to be shipped: No additional medications requested for fill at this time     Changes to insurance: No    Delivery Scheduled: Yes, Expected medication delivery date: 5/17.     Medication will be delivered via UPS to the confirmed prescription address in [facility].    The patient will receive a drug information handout for each medication shipped and additional FDA Medication Guides as required.  Verified that patient has previously received a Conservation officer, historic buildings and a Surveyor, mining.    The patient or caregiver noted above participated in the development of this care plan and knows that they can request review of or adjustments to the care plan at any time.      All of the patient's questions and concerns have been addressed.    Julianne Rice   [facility] Specialty Pharmacist

## 2021-09-21 MED FILL — ORENCIA CLICKJECT 125 MG/ML SUBCUTANEOUS AUTO-INJECTOR: SUBCUTANEOUS | 28 days supply | Qty: 4 | Fill #1

## 2021-10-11 MED FILL — OZEMPIC 1 MG/DOSE (4 MG/3 ML) SUBCUTANEOUS PEN INJECTOR: SUBCUTANEOUS | 28 days supply | Qty: 3 | Fill #1

## 2021-11-16 NOTE — Unmapped (Signed)
[place] [facility] ([facility]) Clinic-- Medication Adherence  UNREACHABLE    Angelica Patterson is a 51 y.o. female identified as having an adherence of <60%  for a hypertension medication, based on payer reports and chart review.       1st attempt call made to the patient's Cell number(s). There was no answer & voicemail was left to return call.       Population:  [id number]    Medication(s):  Blood pressure: lisinopril 5mg     Last Filled on 06/27/21   Refills remaining:unknown      Baldemar Dady  Jimmey Ralph , CPhT  Certified Pharmacy Technician  [place] [facility]ations [facility] (CAMP)

## 2021-11-17 NOTE — Unmapped (Signed)
[place] [facility] ([facility]) [facility]-- Medication Adherence  UNREACHABLE    Keishana Klinger is a 51 y.o. female identified as having an adherence of <60%  for a hypertension medication, based on payer reports and chart review.       2nd attempt call made to the patient's Cell number(s). Patient answered & requested a call back at a later time (thurs).       Population:  [id number]    Medication(s):    Blood pressure: lisinopril 5mg     Last Filled on 06/27/21   Refills remaining:unknown      Rikki Trosper  Jimmey Ralph , CPhT  Certified Pharmacy Technician  [place] [facility] of Medications Program (CAMP)

## 2021-11-18 ENCOUNTER — Other Ambulatory Visit: Payer: Self-pay | Admitting: Internal Medicine

## 2021-11-18 DIAGNOSIS — E1165 Type 2 diabetes mellitus with hyperglycemia: Secondary | ICD-10-CM

## 2021-11-18 MED ORDER — OZEMPIC 1 MG/DOSE (4 MG/3 ML) SUBCUTANEOUS PEN INJECTOR
SUBCUTANEOUS | 2 refills | 28 days
Start: 2021-11-18 — End: ?

## 2021-11-18 NOTE — Unmapped (Signed)
[facility] Refill Coordination Note    Specialty Medication(s) to be Shipped:   Inflammatory Disorders: Orencia    Other medication(s) to be shipped:  Ozempic 1mg /dose     Angelica Patterson, DOB: 1970-07-07  Phone: [phone] (home)       All above HIPAA information was verified with patient.     Was a Nurse, learning disability used for this call? No    Completed refill call assessment today to schedule patient's medication shipment from the [facility] [phone]).  All relevant notes have been reviewed.     Specialty medication(s) and dose(s) confirmed: Regimen is correct and unchanged.   Changes to medications: Sible reports no changes at this time.  Changes to insurance: No  New side effects reported not previously addressed with a pharmacist or physician: None reported  Questions for the pharmacist: No    Confirmed patient received a Conservation officer, historic buildings and a Surveyor, mining with first shipment. The patient will receive a drug information handout for each medication shipped and additional FDA Medication Guides as required.       DISEASE/MEDICATION-SPECIFIC INFORMATION        For patients on injectable medications: Patient currently has 1 doses left.  Next injection is scheduled for 11/20/21.    SPECIALTY MEDICATION ADHERENCE     Medication Adherence    Patient reported X missed doses in the last month: 0  Specialty Medication: ORENCIA CLICKJECT 125 mg/mL Atin subcutaneous auto-injector (abatacept)  Patient is on additional specialty medications: No  Patient is on more than two specialty medications: No  Informant: patient  Reliability of informant: reliable  Provider-estimated medication adherence level: good  Reasons for non-adherence: no problems identified              Were doses missed due to medication being on hold? No    ORENCIA CLICKJECT 125 mg/mL Atin subcutaneous auto-injector  : 7 days of medicine on hand       REFERRAL TO PHARMACIST     Referral to the pharmacist: Not needed      [facility]     Shipping address confirmed in Epic.     Delivery Scheduled: Yes, Expected medication delivery date: 11/24/21.     Medication will be delivered via UPS to the prescription address in Epic WAM.    Kashis Penley' [facility] Specialty Technician

## 2021-11-19 MED ORDER — SEMAGLUTIDE 1 MG/DOSE (4 MG/3 ML) SUBCUTANEOUS PEN INJECTOR
SUBCUTANEOUS | 2 refills | 28 days
Start: 2021-11-19 — End: ?

## 2021-11-19 MED ORDER — OZEMPIC 1 MG/DOSE (4 MG/3 ML) SUBCUTANEOUS PEN INJECTOR
SUBCUTANEOUS | 1 refills | 0 days
Start: 2021-11-19 — End: ?

## 2021-11-23 MED FILL — ORENCIA CLICKJECT 125 MG/ML SUBCUTANEOUS AUTO-INJECTOR: SUBCUTANEOUS | 28 days supply | Qty: 4 | Fill #2

## 2021-11-23 MED FILL — OZEMPIC 1 MG/DOSE (4 MG/3 ML) SUBCUTANEOUS PEN INJECTOR: SUBCUTANEOUS | 28 days supply | Qty: 3 | Fill #0

## 2021-12-10 ENCOUNTER — Ambulatory Visit: Payer: Commercial Managed Care - PPO | Admitting: Internal Medicine

## 2021-12-27 ENCOUNTER — Telehealth: Payer: Self-pay | Admitting: Gastroenterology

## 2021-12-27 MED FILL — OZEMPIC 1 MG/DOSE (4 MG/3 ML) SUBCUTANEOUS PEN INJECTOR: SUBCUTANEOUS | 28 days supply | Qty: 3 | Fill #1

## 2021-12-27 NOTE — Telephone Encounter (Signed)
Patient called, would like her Omeprazole 40 mg twice a day to be change to 80 mg once a day. Patient states her insurance would not cover unless it's changed. Please call to advise.

## 2021-12-28 NOTE — Telephone Encounter (Signed)
LVM for patient to call back  We haven't prescribed her Protonix so she needs to call the doctor that has prescribed it to initiate PA. For the Omeprazole we didn't prescribe that either so we need to clarify because her medication list says Protonix no omeprazole and her omeprazole is daily so if she is having trouble we need to see about an office visit

## 2021-12-29 NOTE — Telephone Encounter (Signed)
LVM for patient to call back and mychart message sent 

## 2021-12-30 NOTE — Telephone Encounter (Signed)
Patient will call pharmacy and see who wrote her last script and will call us with any questions or concerns.

## 2022-01-03 MED ORDER — ORENCIA CLICKJECT 125 MG/ML SUBCUTANEOUS AUTO-INJECTOR
SUBCUTANEOUS | 2 refills | 28 days
Start: 2022-01-03 — End: ?

## 2022-01-03 MED ORDER — ABATACEPT 125 MG/ML SUBCUTANEOUS AUTO-INJECTOR
SUBCUTANEOUS | 2 refills | 28 days
Start: 2022-01-03 — End: ?

## 2022-01-03 NOTE — Unmapped (Signed)
[facility] Refill Coordination Note    Specialty Medication(s) to be Shipped:   Inflammatory Disorders: Orencia    Other medication(s) to be shipped: No additional medications requested for fill at this time     Angelica Patterson, DOB: 12/07/1970  Phone: [phone] (home)       All above HIPAA information was verified with patient.     Was a Nurse, learning disability used for this call? No    Completed refill call assessment today to schedule patient's medication shipment from the [facility] [phone]).  All relevant notes have been reviewed.     Specialty medication(s) and dose(s) confirmed: Regimen is correct and unchanged.   Changes to medications: Glennisha reports no changes at this time.  Changes to insurance: No  New side effects reported not previously addressed with a pharmacist or physician: None reported  Questions for the pharmacist: No    Confirmed patient received a Conservation officer, historic buildings and a Surveyor, mining with first shipment. The patient will receive a drug information handout for each medication shipped and additional FDA Medication Guides as required.       DISEASE/MEDICATION-SPECIFIC INFORMATION        For patients on injectable medications: Patient currently has 0 doses left.  Next injection is scheduled for 9/2.    SPECIALTY MEDICATION ADHERENCE     Medication Adherence    Patient reported X missed doses in the last month: 0  Specialty Medication: Orencia  Patient is on additional specialty medications: No  Patient is on more than two specialty medications: No  Any gaps in refill history greater than 2 weeks in the last 3 months: no  Demonstrates understanding of importance of adherence: yes  Informant: patient                          Were doses missed due to medication being on hold? No    Orencia 125mg /ml: Patient has 0 days of medication on hand    REFERRAL TO PHARMACIST     Referral to the pharmacist: Not needed      [facility]     Shipping address confirmed in Epic.     Delivery Scheduled: Yes, Expected medication delivery date: 8/31.  However, Rx request for refills was sent to the provider as there are none remaining.     Medication will be delivered via UPS to the prescription address in Epic WAM.    Olga Millers   [facility] Specialty Technician

## 2022-01-07 NOTE — Unmapped (Signed)
Gertie Baron 's ORENCIA CLICKJECT 125 mg/mL Atin subcutaneous auto-injector (abatacept) shipment will be delayed as a result of no refills remain on the prescription.      I have reached out to the patient  at [phone] and communicated the delay. We will call the patient back to reschedule the delivery upon resolution. We have not confirmed the new delivery date.

## 2022-01-07 NOTE — Unmapped (Signed)
Angelica Patterson 's ORENCIA CLICKJECT 125 mg/mL Atin subcutaneous auto-injector (abatacept) shipment will be sent out  as a result of a new prescription for the medication has been received.      I have reached out to the patient  at [phone] and communicated the delivery change. We will reschedule the medication for the delivery date that the patient agreed upon.  We have confirmed the delivery date as 01/12/2022, via ups.

## 2022-01-11 MED FILL — ORENCIA CLICKJECT 125 MG/ML SUBCUTANEOUS AUTO-INJECTOR: SUBCUTANEOUS | 28 days supply | Qty: 4 | Fill #0

## 2022-01-25 ENCOUNTER — Other Ambulatory Visit (HOSPITAL_COMMUNITY): Payer: Self-pay

## 2022-01-25 ENCOUNTER — Encounter: Payer: Self-pay | Admitting: Internal Medicine

## 2022-01-25 ENCOUNTER — Ambulatory Visit (INDEPENDENT_AMBULATORY_CARE_PROVIDER_SITE_OTHER): Payer: Commercial Managed Care - PPO | Admitting: Internal Medicine

## 2022-01-25 VITALS — BP 128/80 | HR 78 | Ht 62.0 in | Wt 200.4 lb

## 2022-01-25 DIAGNOSIS — E1165 Type 2 diabetes mellitus with hyperglycemia: Secondary | ICD-10-CM

## 2022-01-25 DIAGNOSIS — E66812 Obesity, class 2: Secondary | ICD-10-CM

## 2022-01-25 DIAGNOSIS — E669 Obesity, unspecified: Secondary | ICD-10-CM

## 2022-01-25 DIAGNOSIS — M069 Rheumatoid arthritis, unspecified: Secondary | ICD-10-CM

## 2022-01-25 DIAGNOSIS — E781 Pure hyperglyceridemia: Secondary | ICD-10-CM

## 2022-01-25 LAB — POCT GLYCOSYLATED HEMOGLOBIN (HGB A1C): Hemoglobin A1C: 10.5 % — AB (ref 4.0–5.6)

## 2022-01-25 MED ORDER — OZEMPIC 1 MG/DOSE (4 MG/3 ML) SUBCUTANEOUS PEN INJECTOR
SUBCUTANEOUS | 1 refills | 84 days
Start: 2022-01-25 — End: ?

## 2022-01-25 MED ORDER — TOUJEO SOLOSTAR U-300 INSULIN 300 UNIT/ML (1.5 ML) SUBCUTANEOUS PEN
Freq: Every evening | SUBCUTANEOUS | 3 refills | 45 days
Start: 2022-01-25 — End: ?

## 2022-01-25 MED ORDER — ONETOUCH VERIO REFLECT W/DEVICE KIT
PACK | 0 refills | Status: AC
Start: 2022-01-25 — End: ?
  Filled 2022-01-25: qty 1, 30d supply, fill #0

## 2022-01-25 MED ORDER — OZEMPIC (1 MG/DOSE) 4 MG/3ML ~~LOC~~ SOPN: PEN_INJECTOR | SUBCUTANEOUS | 1 refills | Status: AC

## 2022-01-25 MED ORDER — TOUJEO SOLOSTAR 300 UNIT/ML ~~LOC~~ SOPN
30.0000 [IU] | PEN_INJECTOR | Freq: Every day | SUBCUTANEOUS | 3 refills | Status: DC
  Filled 2022-01-25: qty 3, 30d supply, fill #0

## 2022-01-25 MED ORDER — TOUJEO SOLOSTAR 300 UNIT/ML ~~LOC~~ SOPN: 30.0000 [IU] | PEN_INJECTOR | Freq: Every day | SUBCUTANEOUS | 3 refills | Status: AC

## 2022-01-25 NOTE — Patient Instructions (Addendum)
Please restart: - Glipizide 5 mg before b'fast and dinner - Tresiba 30 units daily  Continue: - Ozempic 1 mg weekly in a.m.  Please send me the sugars in 2 weeks.  Please return in 3 months with your sugar log.

## 2022-01-25 NOTE — Progress Notes (Signed)
Patient ID: Morgan Drake, female   DOB: Jun 04, 1970, 51 y.o.   MRN: [id number]  HPI: Morgan Drake is a 51 y.o.-year-old female, initially referred by Dr. [name] ([place], [place]), returning for f/u for DM2, dx as GDM in 1993 and 1998 and DM2 in 1999, insulin-dependent, uncontrolled, without long-term complications.  Last visit 6 months ago. PCP: Dr. Helene Kelp - [place], [place].  Interim history: No increased urination, blurry vision, chest pain. She has a 19 mo grandson >> keeps her up at night. Also, because of him, she cannot find her meter and she stopped taking her insulin 3 weeks ago.  She had stopped taking the glipizide after last visit.  She noticed blurry vision and increased urination along with weight loss in the last 3 weeks.  Reviewed HbA1c levels: Lab Results  Component Value Date   HGBA1C 9.7 (A) 08/06/2021   HGBA1C 10.9 (A) 05/11/2021   HGBA1C 8.8 (A) 10/01/2019  03/18/2021: HbA1c 12.4% 10/01/2019: HbA1c calculated from fructosamine is 7.9%, slightly better than the directly measured HbA1c. 07/11/2019: HbA1c 8.7% 07/02/2018: HbA1c calculated from fructosamine is higher than before, at 7.8% 02/26/2018: HbA1c calculated from fructosamine is much better than the measured one, at 7.4%. 10/16/2017: HbA1c calculated from fructosamine is much lower than the measured one: 7.2%! 07/17/2017: HbA1c calculated from fructosamine is much better: 7.6%!  Interim history 05/29/2017: HbA1c 10.5% 09/07/2015: 7.1%  Pt was on a regimen of: - Glyxambi 25-5 mg at bedtime (!) - Trulicity 1.5 mg weekly  Currently on:  - Tresiba 12 mg daily - added 05/2021 >> 24 >> Toujeo 30 units >> STOPPED 1 mo ago! - Glipizide 5 mg 2x a day before b'fast and dinner  >> STOPPED after last visit - Ozempic 1 mg weekly - consistently Previously on metformin ER-occasional diarrhea. Previously on Ozempic, tolerated well, but stopped being covered by insurance. She stopped Invokana 100  mg in am, before b'fast >> stopped b/c repeated yeast infections. Also previously on Farxiga 5 mg daily-came off in 2020. We had to stop Trulicity b/c nausea/vomiting. HbA1c increased afterwards She was on reg. Metformin >> tolerated it well for years, then started to have IBS - like sxs and had to stop. She was on Actos, Avandia. She refused insulin in the past.  Pt is not checking sugars as she lost her meter - From before: - am:   240-270 >> 162-307, 404 (coffee) >> 140-180 - 2h after b'fast: 300s after creamer >> n/c >> 224 >> n/c - before lunch: 85, 110-114 >> 99-140 >> n/c >> 123 >> n/c - 2h after lunch: n/c >> up to 300 >> 154-199, 340 >> n/c - before dinner: 115-120s >> 90s >> n/c - 2h after dinner: 140-150s >> 300s >> 198-268 >> 145 - bedtime: 110-108 >> n/c - nighttime: n/c Lowest sugar was  150 >> 90 >> 200 >> 123 >> 45 - nausea/vomiting >> ?; she has hypoglycemia awareness in the 90s. Highest sugar was 220 >> 202 >> 419, 505 >> 404 >> 275 (icecream) >> ?  Glucometer: One Touch Ultra >> Verio  Pt's meals are: - Breakfast: 1/2 biscuit + lettuce, tomatoes, bacon or bacon or sausage - Lunch: salad + meat  - Dinner: salad + meat + beans or baked potatoes - Snacks: 1: cheese nips or fruit  -No CKD, last BUN/creatinine:  04/12/2021: 9/0.62, GFR 109; glucose 415! - Per review of records in Care everywhere (rheumatology) Lab Results  Component Value Date  BUN 8 08/19/2019   BUN 10 07/02/2018   CREATININE 0.74 08/19/2019   CREATININE 0.69 07/02/2018  05/29/2017:CMP normal, except glucose 102.  BUN/creatinine: 10/0.8 On lisinopril.  -+ Hypertriglyceridemia and hypercholesterolemia; last set of lipids: 03/18/2021: 296/673/50/not calculated Lab Results  Component Value Date   CHOL 283 (H) 10/01/2019   HDL 42.20 10/01/2019   LDLDIRECT 107.0 10/01/2019   TRIG (H) 10/01/2019    826.0 Triglyceride is over 400; calculations on Lipids are invalid.   CHOLHDL 7 10/01/2019   05/29/2017: 244/424/47/n/c 09/07/2015: 203/120/74/105 04/2015: 216/169/59/116  On Crestor 40 mg daily << Zocor 10 mg daily . She did not tolerate Vascepa.  I previously recommended fenofibrate, but did not start yet.  - last eye exam was 2022: reportedly No DR, + cataracts - reportedly.  -She denies numbness and tingling in her feet.  She had this in the past but it improved after stopping bread.  Foot exam was performed by PCP at last annual physical exam from 03/2021.  She also has a history of fatty liver, IBS with both constipation and diarrhea, anxiety/depression, GERD, PCOS (she had to have fertility treatment when she got pregnant both times).   She is seeing Dr. Gavin Pound [place], [place]) with rheumatology for inflammatory arthritis (seronegative). She was dx'ed with RA >> on MTX.  She had anaphylaxis after NSAIDs in the past. 2 daughters have gluten sensitivity and 1 daughter with Chrohn ds.  ROS: + see HPI  I reviewed pt's medications, allergies, PMH, social hx, family hx, and changes were documented in the history of present illness. Otherwise, unchanged from my initial visit note.  Past Medical History:  Diagnosis Date   Acid reflux    Anxiety    Atopic dermatitis, mild    Bell's palsy    Diabetes mellitus without complication ([place])    type 2   Diabetic peripheral neuropathy (HCC)    GI bleed    Hemorrhoids    Hiatal hernia    History of peptic ulcer disease    Hyperlipidemia    Hypertension    Hypertriglyceridemia    > 1000   Kidney stone    Metatarsal fracture    Obesity    PCOS (polycystic ovarian syndrome)    Pericarditis 2008   Hospitalized at [facility] overnight, told she had an MI, no cath. Thinks they said pericarditis   Rheumatoid arthritis ([place]) 12/18/2019   TB lung, latent 08/19/2019   Tonsil stone    Vitiligo 12/18/2019   Past Surgical History:  Procedure Laterality Date   ABDOMINAL HYSTERECTOMY     CESAREAN SECTION     CHOLECYSTECTOMY      COLONOSCOPY  10/15/2008   Small internal Hemorrhoids (mostly likely etiology of BRBPR) Otherwise normal colonoscopy to terminal ileum.   ESOPHAGOGASTRODUODENOSCOPY  03/09/2015   Hiatal hernia. Healed distal esophageal erosions. 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     Blood Glucose Monitoring Suppl (ONETOUCH VERIO REFLECT) w/Device KIT Use as advised 1 kit 0   Continuous Blood Gluc Sensor (FREESTYLE LIBRE 14 DAY SENSOR) MISC 1 each by Does not apply route every 14 (fourteen) days. Change every 2 weeks 2 each 11   EPINEPHrine 0.3 mg/0.3 mL IJ SOAJ injection INJ THE CONTENT OF 1 PEN UTD  0   furosemide (LASIX) 20 MG tablet Take 20 mg by mouth daily as needed.     glipiZIDE (GLUCOTROL) 5 MG tablet TAKE 1 TABLET BY MOUTH TWICE DAILY 180 tablet 1   glucose blood (FREESTYLE LITE) test strip Use to check blood sugar once a day. 100 each 12   glucose blood (ONETOUCH VERIO) test strip Use as instructed 2x a day 200 each 3   HYDROcodone-acetaminophen (NORCO/VICODIN) 5-325 MG tablet      insulin glargine, 1 Unit Dial, (TOUJEO SOLOSTAR) 300 UNIT/ML Solostar Pen Inject 16 Units into the skin at bedtime. 4.5 mL 3   Insulin Pen Needle 32G X 4 MM MISC Use 1x a day 100 each 3   Lancets (ONETOUCH DELICA PLUS [id number]) MISC Check sugars 2x  a day 200 each 3   leucovorin (WELLCOVORIN) 5 MG tablet Take 5 mg by mouth daily.     lisinopril (ZESTRIL) 5 MG tablet Take 5 mg by mouth daily.     methotrexate 50 MG/2ML injection Inject 15 mg into the skin once a week.     Multiple Vitamin (MULTIVITAMIN) tablet Take 1 tablet by mouth daily.     Needles & Syringes (1CC TB SYRINGE) MISC 27g 1/2"     NON FORMULARY      OZEMPIC, 1 MG/DOSE, 4 MG/3ML SOPN Inject 1 mg under the skin once a week. 9 mL 1   pantoprazole (PROTONIX) 40 MG tablet TAKE 1 TABLET BY MOUTH TWICE DAILY BEFORE A MEAL 60 tablet 6   rosuvastatin (CRESTOR) 5 MG tablet Take 5 mg by mouth at bedtime.     triamcinolone (NASACORT ALLERGY 24HR) 55 MCG/ACT AERO nasal inhaler Use one spray in each nostril three to seven times weekly as directed. (Patient not taking: Reported on 07/09/2021) 1 Inhaler 0   No current facility-administered medications on file prior to visit.   Allergies  Allergen Reactions   Nsaids Anaphylaxis   Eggs Or Egg-Derived Products Other (See Comments)    sensitivity    Magnesium Citrate Nausea And Vomiting   Peppermint Oil Swelling   Certolizumab Pegol Other (See Comments)   Cinnamon Other (See Comments)    Blisters in mouth   Influenza Virus Vaccine Other (See Comments)   Leflunomide Other (See Comments)   Meloxicam Other (See Comments)   Methotrexate Other (See Comments)   Other Swelling    mint   Pineapple Other (See Comments)    Tongue feels like furry spikes    Prednisone Other (See Comments)   Tomato Other (See Comments)   Family History  Problem Relation Age of Onset   Depression Mother    Heart disease Mother    Alcoholism Mother    Hyperlipidemia Mother    Congestive Heart Failure Mother    CAD Mother    Anemia Mother    GER  disease Mother    Rheum arthritis Mother    Heart attack Mother    Ovarian cancer Mother    COPD Mother    Diabetes Mother    Berenice Primas' disease Mother    Irritable bowel syndrome Mother    Dementia Mother     Lung cancer Father    Hypertension Father    Hyperlipidemia Father    CAD Father    Heart attack Father    Diabetes Father    Mesothelioma Father        Asbestos exposure   COPD Father    Hashimoto's thyroiditis Sister    Diabetes Sister    Rheum arthritis Sister    Hyperlipidemia Sister    Diabetes Sister    Hyperlipidemia Sister    Hypothyroidism Sister    Heart attack Brother    Graves' disease Brother    Diabetes Brother    Hyperlipidemia Brother    CAD Maternal Grandmother    Congestive Heart Failure Maternal Grandmother    Diabetes Maternal Grandmother    Heart attack Maternal Grandmother    Breast cancer Maternal Grandmother        65-67   Hyperlipidemia Maternal Grandmother    Esophageal cancer Maternal Grandfather    Barrett's esophagus Maternal Grandfather    Heart disease Maternal Grandfather    Congestive Heart Failure Maternal Grandfather    COPD Maternal Grandfather    Diabetes Paternal Grandmother    Thyroid cancer Paternal Grandmother    Diabetes Paternal Grandfather    Heart disease Paternal Grandfather    Hyperlipidemia Paternal Grandfather    COPD Other    Colon cancer Neg Hx    Rectal cancer Neg Hx    Stomach cancer Neg Hx    PE: BP 128/80 (BP Location: Left Arm, Patient Position: Sitting, Cuff Size: Normal)   Pulse 78   Ht '5\' 2"'  (1.575 m)   Wt 200 lb 6.4 oz (90.9 kg)   SpO2 98%   BMI 36.65 kg/m  Wt Readings from Last 3 Encounters:  01/25/22 200 lb 6.4 oz (90.9 kg)  08/06/21 210 lb 12.8 oz (95.6 kg)  07/09/21 211 lb (95.7 kg)   Constitutional: overweight, in NAD Eyes:  EOMI, no exophthalmos ENT: no neck masses, no cervical lymphadenopathy Cardiovascular: RRR, No MRG Respiratory: CTA B Musculoskeletal: no deformities Skin:no rashes Neurological: no tremor with outstretched hands  ASSESSMENT: 1. DM2, non-insulin-dependent, uncontrolled, without long term complications, but with hyperglycemia - we stopped Trulicity b/c N/V, Invokana  b/c Yeast vaginitis  2. HTG  3. Obesity class 3  4.  Family history of thyroid disease -Graves' disease in mother and brother -Hypothyroidism in sister  PLAN:  1. Patient with longstanding, uncontrolled, type 2 diabetes, worsened after her anaphylaxis episode to NSAIDs. -In 2019, she had a period of time in which she was homeless and missing many medications.  However, afterwards, sugars did improve but remained above target.   -In 2020, she came off [place] despite tolerating it well and not having any yeast infections, as she had with Invokana.  We started Ozempic.  She tolerated it well.   -In 01/23, she returned after long absence of 1 year and 7 months.  HbA1c was higher, at 10.9%.  Sugars were very high, in the 200s in the morning and up to 300s later in the day.  We started long-acting insulin.  We could not add back metformin due to IBS. -In 06/2021, sugars were still high, especially after coffee.  I  advised her to only drink 1 coffee in the morning.  We also increased the Tresiba dose and tried to add back Ozempic. -In 07/2021, she was able to start Ozempic but unfortunately she started directly on the 1 mg dose and had nausea and vomiting with this.  However the symptoms improved and sugars also started to improve.  HbA1c at that time was 9.7%, improved -At today's visit, she is not checking blood sugars and has been off insulin for 3 weeks.  She is very busy and feels that her life is falling apart taking care of her 29-monthold grandson.  She has 2 other grandsons living with her.  At today's visit, we discussed about the importance of starting back checking blood sugars.  I gave her a blood sugar log and advised her how to check blood sugars and to send the values to me in 2 weeks. -He stopped taking her insulin 2 weeks ago.  I advised her against doing this in the future, as it is obvious that her sugars are very high now, based on her symptoms and her very high HbA1c (see below).   We will restart Toujeo at the previous dose and also glipizide, which she actually stopped at last visit.  I am not sure if glipizide would be enough to control her postprandial blood sugars, if not, at next visit we need to start mealtime insulin. -She is taking Ozempic consistently and will continue this - I suggested to:  Patient Instructions  Please restart: - Glipizide 5 mg before b'fast and dinner - Toujeo 30 units daily  Continue: - Ozempic 1 mg weekly in a.m.  Please send me the sugars in 2 weeks.  Please return in 3 months with your sugar log.   - we checked her HbA1c: 10.5% (higher) - advised to check sugars at different times of the day - 4x a day, rotating check times - advised for yearly eye exams >> she is UTD - return to clinic in 3-4 months  2. HTG -Review latest lipid panel from 03/2021: Very high triglycerides and total cholesterol -She continues on Crestor 40 mg daily.  I previously suggested fenofibrate but she did not start. -We will check a lipid panel at the  3. Obesity class 2 -On Ozempic, which should also help with weight loss -She gained 9 pounds before the last 2 visits combined -she lost 10 pounds recently most likely due to glucotoxicity  CPhilemon Kingdom MD PhD [facility]

## 2022-01-27 ENCOUNTER — Other Ambulatory Visit (HOSPITAL_COMMUNITY): Payer: Self-pay

## 2022-02-07 NOTE — Unmapped (Signed)
Angelica Patterson has been contacted in regards to their refill of Orencia. At this time, they have declined refill due to  not liking how the medication makes her feel. She has a follow up in a few weeks to discuss it with her provider . Refill assessment call date has been updated per the patient's request.

## 2022-02-07 NOTE — Unmapped (Unsigned)
call

## 2022-02-10 NOTE — Unmapped (Signed)
[facility] ([facility]-- Medication Adherence  UNREACHABLE    Angelica Patterson is a 51 y.o. female identified as having an adherence of <80%  for a hypertension medication, based on payer reports and chart review.       2nd attempt call made to the patient's Cell number(s). There was no answer & unable to leave a voicemail.       Population:  [id number]    Medication(s):  Blood pressure: lisinopril 5mg     Last Filled on 07/25/2021   Refills remaining:no     Additional Details & Actions Taken:   none      Tenna Delaine , CPhT  Certified Pharmacy Technician  [place] [facility] ([facility])

## 2022-02-11 ENCOUNTER — Other Ambulatory Visit (HOSPITAL_COMMUNITY): Payer: Self-pay

## 2022-02-17 MED FILL — TOUJEO SOLOSTAR U-300 INSULIN 300 UNIT/ML (1.5 ML) SUBCUTANEOUS PEN: SUBCUTANEOUS | 45 days supply | Qty: 4.5 | Fill #0

## 2022-03-15 NOTE — Unmapped (Signed)
The [facility] has made a third and final attempt to reach this patient to refill the following medication:Toujeo.      We have left voicemails on the following phone numbers: [phone] and have sent a Mychart questionnaire..    Dates contacted: 10/24, 11/3, 11/7  Last scheduled delivery: 10/12    The patient may be at risk of non-compliance with this medication. The patient should call the [facility] at [phone]  Option 4, then Option 2 (all other specialty patients) to refill medication.    Olga Millers   [facility] Specialty Technician

## 2022-03-21 NOTE — Unmapped (Signed)
Specialty Medication(s): Orencia    Angelica Patterson has been dis-enrolled from the [facility] specialty pharmacy services due to medication discontinuation resulting from side effect intolerance.    Additional information provided to the patient: n/a    [name], PharmD  [facility] Specialty Pharmacist

## 2022-03-23 MED FILL — OZEMPIC 1 MG/DOSE (4 MG/3 ML) SUBCUTANEOUS PEN INJECTOR: SUBCUTANEOUS | 84 days supply | Qty: 9 | Fill #0

## 2022-04-27 ENCOUNTER — Ambulatory Visit: Payer: Commercial Managed Care - PPO | Admitting: Internal Medicine

## 2022-04-27 NOTE — Progress Notes (Deleted)
Patient ID: Morgan Drake, female   DOB: 1971/01/19, 51 y.o.   MRN: [id number]  HPI: Morgan Drake is a 51 y.o.-year-old female, initially referred by Dr. [name] ([place], [place]), returning for f/u for DM2, dx as GDM in 1993 and 1998 and DM2 in 1999, insulin-dependent, uncontrolled, without long-term complications.  Last visit 3 months ago. PCP: Dr. Helene Kelp - [place], [place].  Interim history: No increased urination, blurry vision, chest pain.  Reviewed HbA1c levels: Lab Results  Component Value Date   HGBA1C 10.5 (A) 01/25/2022   HGBA1C 9.7 (A) 08/06/2021   HGBA1C 10.9 (A) 05/11/2021  03/18/2021: HbA1c 12.4% 10/01/2019: HbA1c calculated from fructosamine is 7.9%, slightly better than the directly measured HbA1c. 07/11/2019: HbA1c 8.7% 07/02/2018: HbA1c calculated from fructosamine is higher than before, at 7.8% 02/26/2018: HbA1c calculated from fructosamine is much better than the measured one, at 7.4%. 10/16/2017: HbA1c calculated from fructosamine is much lower than the measured one: 7.2%! 07/17/2017: HbA1c calculated from fructosamine is much better: 7.6%!  Interim history 05/29/2017: HbA1c 10.5% 09/07/2015: 7.1%  Pt was on a regimen of: - Glyxambi 25-5 mg at bedtime (!) - Trulicity 1.5 mg weekly  Currently on:  - Tresiba 12 mg daily - added 05/2021 >> 24 >> Toujeo 30 units >> STOPPED >> restart at last visit - Glipizide 5 mg 2x a day before b'fast and dinner  >> STOPPED >> restarted at last visit - Ozempic 1 mg weekly  Previously on metformin ER-occasional diarrhea. Previously on Ozempic, tolerated well, but stopped being covered by insurance. She stopped Invokana 100 mg in am, before b'fast >> stopped b/c repeated yeast infections. Also previously on Farxiga 5 mg daily-came off in 2020. We had to stop Trulicity b/c nausea/vomiting. HbA1c increased afterwards She was on reg. Metformin >> tolerated it well for years, then started to have IBS - like  sxs and had to stop. She was on Actos, Avandia. She refused insulin in the past.  At last visit, she was not checking sugars as she lost her meter - From before: - am:   240-270 >> 162-307, 404 (coffee) >> 140-180 - 2h after b'fast: 300s after creamer >> n/c >> 224 >> n/c - before lunch: 85, 110-114 >> 99-140 >> n/c >> 123 >> n/c - 2h after lunch: n/c >> up to 300 >> 154-199, 340 >> n/c - before dinner: 115-120s >> 90s >> n/c - 2h after dinner: 140-150s >> 300s >> 198-268 >> 145 - bedtime: 110-108 >> n/c - nighttime: n/c Lowest sugar was 90 >> 200 >> 123 >> 45 - nausea/vomiting >> ?; she has hypoglycemia awareness in the 90s. Highest sugar was 505 >> 404 >> 275 (icecream) >> ?  Glucometer: One Touch Ultra >> Verio  Pt's meals are: - Breakfast: 1/2 biscuit + lettuce, tomatoes, bacon or bacon or sausage - Lunch: salad + meat  - Dinner: salad + meat + beans or baked potatoes - Snacks: 1: cheese nips or fruit  -No CKD, last BUN/creatinine:  04/12/2021: 9/0.62, GFR 109; glucose 415! - Per review of records in Care everywhere (rheumatology) Lab Results  Component Value Date   BUN 8 08/19/2019   BUN 10 07/02/2018   CREATININE 0.74 08/19/2019   CREATININE 0.69 07/02/2018  05/29/2017:CMP normal, except glucose 102.  BUN/creatinine: 10/0.8 On lisinopril.  -+ Hypertriglyceridemia and hypercholesterolemia; last set of lipids: 03/18/2021: 296/673/50/not calculated Lab Results  Component Value Date   CHOL 283 (H) 10/01/2019   HDL 42.20 10/01/2019  LDLDIRECT 107.0 10/01/2019   TRIG (H) 10/01/2019    826.0 Triglyceride is over 400; calculations on Lipids are invalid.   CHOLHDL 7 10/01/2019  05/29/2017: 244/424/47/n/c 09/07/2015: 203/120/74/105 04/2015: 216/169/59/116  On Crestor 40 mg daily << Zocor 10 mg daily. She did not tolerate Vascepa.  I previously recommended fenofibrate, but did not start yet.  - last eye exam was 2022: reportedly No DR, + cataracts - reportedly.  -She  denies numbness and tingling in her feet.  She had this in the past but it improved after stopping bread.  Foot exam was performed by PCP at last annual physical exam from 03/2021.  She also has a history of fatty liver, IBS with both constipation and diarrhea, anxiety/depression, GERD, PCOS (she had to have fertility treatment when she got pregnant both times).   She is seeing Dr. Gavin Pound [facility] [place], [place]) with rheumatology for inflammatory arthritis (seronegative). She was dx'ed with RA >> on MTX.  She had anaphylaxis after NSAIDs in the past. 2 daughters have gluten sensitivity and 1 daughter with Chrohn ds.  ROS: + see HPI  I reviewed pt's medications, allergies, PMH, social hx, family hx, and changes were documented in the history of present illness. Otherwise, unchanged from my initial visit note.  Past Medical History:  Diagnosis Date   Acid reflux    Anxiety    Atopic dermatitis, mild    Bell's palsy    Diabetes mellitus without complication ([place])    type 2   Diabetic peripheral neuropathy (HCC)    GI bleed    Hemorrhoids    Hiatal hernia    History of peptic ulcer disease    Hyperlipidemia    Hypertension    Hypertriglyceridemia    > 1000   Kidney stone    Metatarsal fracture    Obesity    PCOS (polycystic ovarian syndrome)    Pericarditis 2008   Hospitalized at [facility] overnight, told she had an MI, no cath. Thinks they said pericarditis   Rheumatoid arthritis ([place]) 12/18/2019   TB lung, latent 08/19/2019   Tonsil stone    Vitiligo 12/18/2019   Past Surgical History:  Procedure Laterality Date   ABDOMINAL HYSTERECTOMY     CESAREAN SECTION     CHOLECYSTECTOMY     COLONOSCOPY  10/15/2008   Small internal Hemorrhoids (mostly likely etiology of BRBPR) Otherwise normal colonoscopy to terminal ileum.   ESOPHAGOGASTRODUODENOSCOPY  03/09/2015   Hiatal hernia. Healed distal esophageal erosions. 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     Blood Glucose Monitoring Suppl (ONETOUCH VERIO REFLECT) w/Device KIT Use  as advised 1 kit 0   Continuous Blood Gluc Sensor (FREESTYLE LIBRE 14 DAY SENSOR) MISC 1 each by Does not apply route every 14 (fourteen) days. Change every 2 weeks 2 each 11   EPINEPHrine 0.3 mg/0.3 mL IJ SOAJ injection INJ THE CONTENT OF 1 PEN UTD  0   furosemide (LASIX) 20 MG tablet Take 20 mg by mouth daily as needed.     glipiZIDE (GLUCOTROL) 5 MG tablet TAKE 1 TABLET BY MOUTH TWICE DAILY (Patient not taking: Reported on 01/25/2022) 180 tablet 1   glucose blood (FREESTYLE LITE) test strip Use to check blood sugar once a day. 100 each 12   glucose blood (ONETOUCH VERIO) test strip Use as instructed 2x a day 200 each 3   HYDROcodone-acetaminophen (NORCO/VICODIN) 5-325 MG tablet      insulin glargine, 1 Unit Dial, (TOUJEO SOLOSTAR) 300 UNIT/ML Solostar Pen Inject 30 Units into the skin at bedtime. 4.5 mL 3   Insulin Pen Needle 32G X 4 MM MISC Use 1x a day 100 each 3   Lancets (ONETOUCH DELICA PLUS [id number]) MISC Check sugars 2x a day 200 each 3   leucovorin (WELLCOVORIN) 5 MG tablet Take 5 mg by mouth daily.     lisinopril (ZESTRIL) 5 MG tablet Take 5 mg by mouth daily.     methotrexate 50 MG/2ML injection Inject 15 mg into the skin once a week.     Multiple Vitamin  (MULTIVITAMIN) tablet Take 1 tablet by mouth daily.     Needles & Syringes (1CC TB SYRINGE) MISC 27g 1/2"     NON FORMULARY      pantoprazole (PROTONIX) 40 MG tablet TAKE 1 TABLET BY MOUTH TWICE DAILY BEFORE A MEAL 60 tablet 6   rosuvastatin (CRESTOR) 5 MG tablet Take 5 mg by mouth at bedtime. (Patient not taking: Reported on 01/25/2022)     Semaglutide, 1 MG/DOSE, (OZEMPIC, 1 MG/DOSE,) 4 MG/3ML SOPN Inject 1 mg under the skin once a week. 9 mL 1   triamcinolone (NASACORT ALLERGY 24HR) 55 MCG/ACT AERO nasal inhaler Use one spray in each nostril three to seven times weekly as directed. (Patient not taking: Reported on 07/09/2021) 1 Inhaler 0   No current facility-administered medications on file prior to visit.   Allergies  Allergen Reactions   Nsaids Anaphylaxis   Eggs Or Egg-Derived Products Other (See Comments)    sensitivity    Magnesium Citrate Nausea And Vomiting   Peppermint Oil Swelling   Certolizumab Pegol Other (See Comments)   Cinnamon Other (See Comments)    Blisters in mouth   Influenza Virus Vaccine Other (See Comments)   Leflunomide Other (See Comments)   Meloxicam Other (See Comments)   Methotrexate Other (See Comments)   Other Swelling    mint   Pineapple Other (See Comments)    Tongue feels like furry spikes    Prednisone Other (See Comments)   Tomato Other (See Comments)   Family History  Problem Relation Age of Onset   Depression Mother    Heart disease Mother    Alcoholism Mother    Hyperlipidemia Mother    Congestive Heart Failure Mother    CAD Mother    Anemia Mother    GER disease Mother    Rheum arthritis Mother    Heart attack Mother    Ovarian cancer Mother    COPD Mother    Diabetes Mother    Berenice Primas' disease Mother    Irritable bowel syndrome Mother  Dementia Mother    Lung cancer Father    Hypertension Father    Hyperlipidemia Father    CAD Father    Heart attack Father    Diabetes Father    Mesothelioma Father        Asbestos  exposure   COPD Father    Hashimoto's thyroiditis Sister    Diabetes Sister    Rheum arthritis Sister    Hyperlipidemia Sister    Diabetes Sister    Hyperlipidemia Sister    Hypothyroidism Sister    Heart attack Brother    Graves' disease Brother    Diabetes Brother    Hyperlipidemia Brother    CAD Maternal Grandmother    Congestive Heart Failure Maternal Grandmother    Diabetes Maternal Grandmother    Heart attack Maternal Grandmother    Breast cancer Maternal Grandmother        65-67   Hyperlipidemia Maternal Grandmother    Esophageal cancer Maternal Grandfather    Barrett's esophagus Maternal Grandfather    Heart disease Maternal Grandfather    Congestive Heart Failure Maternal Grandfather    COPD Maternal Grandfather    Diabetes Paternal Grandmother    Thyroid cancer Paternal Grandmother    Diabetes Paternal Grandfather    Heart disease Paternal Grandfather    Hyperlipidemia Paternal Grandfather    COPD Other    Colon cancer Neg Hx    Rectal cancer Neg Hx    Stomach cancer Neg Hx    PE: There were no vitals taken for this visit. Wt Readings from Last 3 Encounters:  01/25/22 200 lb 6.4 oz (90.9 kg)  08/06/21 210 lb 12.8 oz (95.6 kg)  07/09/21 211 lb (95.7 kg)   Constitutional: overweight, in NAD Eyes:  EOMI, no exophthalmos ENT: no neck masses, no cervical lymphadenopathy Cardiovascular: RRR, No MRG Respiratory: CTA B Musculoskeletal: no deformities Skin:no rashes Neurological: no tremor with outstretched hands  ASSESSMENT: 1. DM2, non-insulin-dependent, uncontrolled, without long term complications, but with hyperglycemia - we stopped Trulicity b/c N/V, Invokana b/c Yeast vaginitis  2. HTG  3. Obesity class 3  4.  Family history of thyroid disease -Graves' disease in mother and brother -Hypothyroidism in sister  PLAN:  1. Patient with type 2 diabetes, very uncontrolled, exacerbated after an anaphylactic episodes to NSAIDs.  Reviewed previous  diabetes history: -In 2019, she had a period of time in which she was homeless and missing many medications.  However, afterwards, sugars did improve but remained above target.   -In 2020, she came off [place] despite tolerating it well and not having any yeast infections, as she had with Invokana.  We started Ozempic.  She tolerated it well.   -In 01/23, she returned after long absence of 1 year and 7 months.  HbA1c was higher, at 10.9%.  Sugars were very high, in the 200s in the morning and up to 300s later in the day.  We started long-acting insulin.  We could not add back metformin due to IBS. -In 06/2021, sugars were still high, especially after coffee.  I advised her to only drink 1 coffee in the morning.  We also increased the Tresiba dose and tried to add back Ozempic. -In 07/2021, she was able to start Ozempic but unfortunately she started directly on the 1 mg dose and had nausea and vomiting with this.  However the symptoms improved and sugars also started to improve.  HbA1c at that time was 9.7%, improved   -At today's visit, she is not  checking blood sugars and has been off insulin for 3 weeks.  She is very busy and feels that her life is falling apart taking care of her 86-monthold grandson.  She has 2 other grandsons living with her.  At today's visit, we discussed about the importance of starting back checking blood sugars.  I gave her a blood sugar log and advised her how to check blood sugars and to send the values to me in 2 weeks. -He stopped taking her insulin 2 weeks ago.  I advised her against doing this in the future, as it is obvious that her sugars are very high now, based on her symptoms and her very high HbA1c (see below).  We will restart Toujeo at the previous dose and also glipizide, which she actually stopped at last visit.  I am not sure if glipizide would be enough to control her postprandial blood sugars, if not, at next visit we need to start mealtime insulin. -She is  taking Ozempic consistently and will continue this  - I suggested to:  Patient Instructions  Please continue: - Glipizide 5 mg before b'fast and dinner - Toujeo 30 units daily - Ozempic 1 mg weekly in a.m.  Please return in 3 months with your sugar log.   - we checked her HbA1c: 7%  - advised to check sugars at different times of the day - 1x a day, rotating check times - advised for yearly eye exams >> she is UTD - return to clinic in 3-4 months  2. HTG -Reviewed latest lipid panel from 03/2021: Triglycerides very high, T chol also elevated -She continues on Crestor 40 mg daily.  I previously suggested fenofibrate, but she is not on this  3. Obesity class 2 -She continues on Ozempic which should also help with weight loss -She lost 19 pounds before the last 3 visits combined, most likely due to glucotoxicity.  CPhilemon Kingdom MD PhD L[facility]
